# Patient Record
Sex: Male | Born: 1985 | Race: White | Hispanic: No | Marital: Single
Health system: Southern US, Community
[De-identification: ages and names within clinical notes are randomized; demographics above are authoritative.]

## PROBLEM LIST (undated history)

## (undated) DIAGNOSIS — F411 Generalized anxiety disorder: Secondary | ICD-10-CM

## (undated) DIAGNOSIS — F40298 Other specified phobia: Secondary | ICD-10-CM

## (undated) DIAGNOSIS — F339 Major depressive disorder, recurrent, unspecified: Secondary | ICD-10-CM

## (undated) DIAGNOSIS — M549 Dorsalgia, unspecified: Secondary | ICD-10-CM

## (undated) DIAGNOSIS — E669 Obesity, unspecified: Secondary | ICD-10-CM

## (undated) DIAGNOSIS — I10 Essential (primary) hypertension: Secondary | ICD-10-CM

## (undated) DIAGNOSIS — Z915 Personal history of self-harm: Secondary | ICD-10-CM

## (undated) HISTORY — DX: Generalized anxiety disorder: F41.1

## (undated) HISTORY — DX: Essential (primary) hypertension: I10

## (undated) HISTORY — DX: Dorsalgia, unspecified: M54.9

## (undated) HISTORY — DX: Major depressive disorder, recurrent, unspecified: F33.9

## (undated) HISTORY — DX: Obesity, unspecified: E66.9

## (undated) HISTORY — DX: Other specified phobia: F40.298

## (undated) HISTORY — DX: Personal history of self-harm: Z91.5

---
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
## 2006-05-31 DIAGNOSIS — E669 Obesity, unspecified: Secondary | ICD-10-CM | POA: Insufficient documentation

## 2006-05-31 DIAGNOSIS — F339 Major depressive disorder, recurrent, unspecified: Secondary | ICD-10-CM

## 2006-10-03 ENCOUNTER — Emergency Department (HOSPITAL_COMMUNITY): Admission: EM | Admit: 2006-10-03 | Discharge: 2006-10-03 | Payer: Self-pay | Admitting: Emergency Medicine

## 2006-10-04 ENCOUNTER — Telehealth: Payer: Self-pay | Admitting: *Deleted

## 2006-10-08 ENCOUNTER — Telehealth: Payer: Self-pay | Admitting: *Deleted

## 2006-10-09 ENCOUNTER — Ambulatory Visit: Payer: Self-pay | Admitting: Family Medicine

## 2006-10-09 DIAGNOSIS — F411 Generalized anxiety disorder: Secondary | ICD-10-CM | POA: Insufficient documentation

## 2006-10-24 ENCOUNTER — Ambulatory Visit: Payer: Self-pay | Admitting: Family Medicine

## 2006-10-24 DIAGNOSIS — I10 Essential (primary) hypertension: Secondary | ICD-10-CM | POA: Insufficient documentation

## 2007-07-14 ENCOUNTER — Emergency Department (HOSPITAL_COMMUNITY): Admission: EM | Admit: 2007-07-14 | Discharge: 2007-07-14 | Payer: Self-pay | Admitting: Emergency Medicine

## 2007-07-18 ENCOUNTER — Telehealth: Payer: Self-pay | Admitting: *Deleted

## 2007-07-18 ENCOUNTER — Ambulatory Visit: Payer: Self-pay | Admitting: Family Medicine

## 2007-07-18 DIAGNOSIS — R079 Chest pain, unspecified: Secondary | ICD-10-CM

## 2007-07-18 DIAGNOSIS — M545 Low back pain: Secondary | ICD-10-CM

## 2009-11-29 ENCOUNTER — Encounter: Payer: Self-pay | Admitting: Family Medicine

## 2009-11-29 ENCOUNTER — Ambulatory Visit: Payer: Self-pay | Admitting: Family Medicine

## 2009-11-29 DIAGNOSIS — F40298 Other specified phobia: Secondary | ICD-10-CM

## 2009-11-29 LAB — CONVERTED CEMR LAB
ALT: 25 units/L (ref 0–53)
AST: 18 units/L (ref 0–37)
Albumin/Creatinine Ratio, Urine, POC: <30
Albumin: 4.5 g/dL (ref 3.5–5.2)
Alkaline Phosphatase: 71 units/L (ref 39–117)
Calcium: 9.4 mg/dL (ref 8.4–10.5)
Chloride: 101 meq/L (ref 96–112)
Creatinine, Ser: 0.85 mg/dL (ref 0.40–1.50)
Creatinine,U: 200 mg/dL
Platelets: 274 10*3/uL (ref 150–400)
Potassium: 3.8 meq/L (ref 3.5–5.3)
RDW: 12.7 % (ref 11.5–15.5)

## 2010-02-11 ENCOUNTER — Encounter: Payer: Self-pay | Admitting: Family Medicine

## 2010-03-29 ENCOUNTER — Ambulatory Visit: Payer: Self-pay

## 2010-04-28 ENCOUNTER — Ambulatory Visit: Admission: RE | Admit: 2010-04-28 | Discharge: 2010-04-28 | Payer: Self-pay | Source: Home / Self Care

## 2010-05-05 NOTE — Letter (Signed)
Summary: PHQ9  PHQ9   Imported By: De Nurse 12/03/2009 12:03:36  _____________________________________________________________________  External Attachment:    Type:   Image     Comment:   External Document

## 2010-05-05 NOTE — Miscellaneous (Signed)
Summary: refill request   Clinical Lists Changes received refill request for Prozac. This is not on current med list. will send message to MD to advise about this medication and need for refill. Theresia Lo RN  February 11, 2010 2:40 PM  Patient rx'ed celexa at last appointment.  Has canceled or not kept multiple appointments with me.  I need to see the patient again before refilling to know if we need to adjust dosage.  Thanks,  Selena Batten    patient notified of above message and  he has appointment 02/28/2010 and will discuss at that time. Theresia Lo RN  February 14, 2010 1:52 PM
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
## 2010-05-12 ENCOUNTER — Encounter: Payer: Self-pay | Admitting: Family Medicine

## 2010-05-12 ENCOUNTER — Ambulatory Visit (INDEPENDENT_AMBULATORY_CARE_PROVIDER_SITE_OTHER): Payer: Self-pay | Admitting: Family Medicine

## 2010-05-12 VITALS — BP 148/79 | HR 100 | Ht 67.0 in | Wt 260.0 lb

## 2010-05-12 DIAGNOSIS — M545 Low back pain: Secondary | ICD-10-CM

## 2010-05-12 DIAGNOSIS — E669 Obesity, unspecified: Secondary | ICD-10-CM

## 2010-05-12 DIAGNOSIS — F339 Major depressive disorder, recurrent, unspecified: Secondary | ICD-10-CM

## 2010-05-12 MED ORDER — HYDROCODONE-ACETAMINOPHEN 5-500 MG PO TABS: 1.0000 | ORAL_TABLET | Freq: Four times a day (QID) | ORAL | Status: AC | PRN

## 2010-05-12 NOTE — Assessment & Plan Note (Signed)
Assessment/Plan Still believe he has some underlying depression but currently refuses and medication or counseling help.  Denies any thoughts of suicide or homicide.
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
## 2010-05-12 NOTE — Patient Instructions (Addendum)
I want you to try and continue the exercises you are doing and try the stretches I have given you.  I want you to only take the vicodin if you really feel you need it.  Be careful if you drive while taking this.  Remember as we discussed this is to get you past the point so that you are able to exercise more, and is not a long term medication.  I will let you know about your x-rays when the results return.  I would like to see you back in 2-4 weeks to see how you are doing.  If you are not improving we may need to think about formal physical therapy.  Hope you feel better.

## 2010-05-12 NOTE — Assessment & Plan Note (Signed)
Assessment and Plan Reviewed current diet and given suggestions on how to make changes.  For now told him most important thing is to try and get three meals per day.  Also told him that he should try to limit processed foods and try to incorporate more green vegetables into his diet.   Encouraged to continue with exercise and outdoor activity as much as possible as back pain improves

## 2010-08-19 NOTE — Discharge Summary (Signed)
[facility]  Patient:    Eric Dalton, Eric Dalton                     MRN: [id number] Adm. Date:  [id number] Disc. Date: 02/27/00 Attending:  Veneta Penton                           Discharge Summary  REASON FOR ADMISSION:  This 25 year old white male was admitted status post suicide attempt via drug overdose with Effexor.  For further history of present illness, please see the patients psychiatry admission assessment.  PHYSICAL EXAMINATION AT THE TIME OF ADMISSION:  Morbid obesity.  LABORATORY EXAMINATION:  The patient underwent a laboratory workup to rule out any medical problems contributing to his symptomatology.  Metabolic panel was unremarkable.  CBC showed white count 13.8, MCV 74.3, MCHC 34.6, eosinophil count 11%; otherwise unremarkable.  Thyroid function tests were within normal limits.  GGT was within normal limits.  Hepatic panel was remarkable only for SGPT 41.  Urine drug screen was negative.  The patient received no x-rays, no special procedures, no additional consultations.  He sustained no complications during his hospital course.  HOSPITAL COURSE:  On admission, the patient was superficial, guarded, oppositional and defiant.  He participated only superficially in group therapy.  He has refused to take any responsibility for his actions.  He, however, continues to deny any homicidal or suicidal ideation and although he remains depressed and anxious, he is participating in all aspects of the therapeutic treatment program, has plans for the future, and denies wanting to harm himself or others and consequently is felt to have reached his maximum benefits of hospitalization and is ready for discharge to a less restrictive alternative setting.  CONDITION ON DISCHARGE:  Improved.  DIAGNOSES: Axis I:    1. Major depression, single episode, severe without psychosis.            2. 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            3. Rule out  oppositional defiant disorder.            4. Generalized anxiety disorder. Axis II:   Rule out learning disorder, not otherwise specified. Axis III:  Obesity. Axis IV:   Severe. Axis V:    20 on admission, 30 on discharge.  FURTHER EVALUATION AND TREATMENT RECOMMENDATIONS: 1. The patient is discharged to home. 2. The patient is discharged on a unrestricted level of activity and a regular    diet. 3. The patient is discharged on Effexor XR 150 mg p.o. q.a.m. 4. The patient will follow up with Dr. Kirtland Bouchard, his outpatient psychiatrist at [facility] as well as Edwina Barth, his outpatient therapist, for individual and family therapy and he will continue to be followed at [facility] for all further aspects of his mental health care and consequently I will sign off on the case at this time. DD:  02/27/00 TD:  02/27/00 Job: [id number]

## 2015-05-18 ENCOUNTER — Encounter (HOSPITAL_COMMUNITY): Payer: Self-pay | Admitting: Emergency Medicine

## 2015-05-18 DIAGNOSIS — F411 Generalized anxiety disorder: Secondary | ICD-10-CM | POA: Insufficient documentation

## 2015-05-18 DIAGNOSIS — F1310 Sedative, hypnotic or anxiolytic abuse, uncomplicated: Secondary | ICD-10-CM | POA: Insufficient documentation

## 2015-05-18 DIAGNOSIS — F1210 Cannabis abuse, uncomplicated: Secondary | ICD-10-CM | POA: Insufficient documentation

## 2015-05-18 DIAGNOSIS — F332 Major depressive disorder, recurrent severe without psychotic features: Secondary | ICD-10-CM | POA: Insufficient documentation

## 2015-05-18 DIAGNOSIS — E669 Obesity, unspecified: Secondary | ICD-10-CM | POA: Insufficient documentation

## 2015-05-18 DIAGNOSIS — Z915 Personal history of self-harm: Secondary | ICD-10-CM | POA: Insufficient documentation

## 2015-05-18 DIAGNOSIS — I10 Essential (primary) hypertension: Secondary | ICD-10-CM | POA: Insufficient documentation

## 2015-05-18 DIAGNOSIS — Z87828 Personal history of other (healed) physical injury and trauma: Secondary | ICD-10-CM | POA: Insufficient documentation

## 2015-05-18 LAB — RAPID URINE DRUG SCREEN, HOSP PERFORMED
AMPHETAMINES: NOT DETECTED
BENZODIAZEPINES: POSITIVE — AB
Barbiturates: NOT DETECTED
Cocaine: NOT DETECTED
OPIATES: NOT DETECTED
TETRAHYDROCANNABINOL: POSITIVE — AB

## 2015-05-18 LAB — CBC WITH DIFFERENTIAL/PLATELET
BASOS ABS: 0 10*3/uL (ref 0.0–0.1)
Basophils Relative: 0 %
Eosinophils Absolute: 0.1 10*3/uL (ref 0.0–0.7)
Eosinophils Relative: 2 %
HCT: 43.5 % (ref 39.0–52.0)
HEMOGLOBIN: 14.9 g/dL (ref 13.0–17.0)
LYMPHS ABS: 1.9 10*3/uL (ref 0.7–4.0)
LYMPHS PCT: 25 %
MCH: 28.5 pg (ref 26.0–34.0)
MCHC: 34.3 g/dL (ref 30.0–36.0)
MCV: 83.3 fL (ref 78.0–100.0)
Monocytes Absolute: 0.8 10*3/uL (ref 0.1–1.0)
Monocytes Relative: 10 %
NEUTROS PCT: 63 %
Neutro Abs: 4.8 10*3/uL (ref 1.7–7.7)
Platelets: 255 10*3/uL (ref 150–400)
RBC: 5.22 MIL/uL (ref 4.22–5.81)
RDW: 12.8 % (ref 11.5–15.5)
WBC: 7.7 10*3/uL (ref 4.0–10.5)

## 2015-05-18 LAB — COMPREHENSIVE METABOLIC PANEL
ALT: 15 U/L — AB (ref 17–63)
ANION GAP: 10 (ref 5–15)
AST: 17 U/L (ref 15–41)
Albumin: 4.3 g/dL (ref 3.5–5.0)
Alkaline Phosphatase: 73 U/L (ref 38–126)
BUN: 6 mg/dL (ref 6–20)
CHLORIDE: 101 mmol/L (ref 101–111)
CO2: 28 mmol/L (ref 22–32)
Calcium: 9.6 mg/dL (ref 8.9–10.3)
Creatinine, Ser: 1.05 mg/dL (ref 0.61–1.24)
GFR calc non Af Amer: >60 mL/min (ref >60)
Glucose, Bld: 89 mg/dL (ref 65–99)
POTASSIUM: 3.5 mmol/L (ref 3.5–5.1)
SODIUM: 139 mmol/L (ref 135–145)
Total Bilirubin: 0.4 mg/dL (ref 0.3–1.2)
Total Protein: 7.8 g/dL (ref 6.5–8.1)

## 2015-05-18 LAB — ETHANOL: Alcohol, Ethyl (B): <5 mg/dL (ref <5)

## 2015-05-18 NOTE — ED Notes (Signed)
Pt. requesting psychiatric treatment and medications for his anxiety and depression , denies suicidal intention / no hallucinations .

## 2015-05-19 ENCOUNTER — Emergency Department (HOSPITAL_COMMUNITY)
Admission: EM | Admit: 2015-05-19 | Discharge: 2015-05-19 | Disposition: A | Discharge status: Home / Self Care | Payer: Self-pay | Attending: Emergency Medicine | Admitting: Emergency Medicine

## 2015-05-19 DIAGNOSIS — R4681 Obsessive-compulsive behavior: Secondary | ICD-10-CM

## 2015-05-19 DIAGNOSIS — F332 Major depressive disorder, recurrent severe without psychotic features: Secondary | ICD-10-CM

## 2015-05-19 DIAGNOSIS — F411 Generalized anxiety disorder: Secondary | ICD-10-CM

## 2015-05-19 MED ORDER — ZOLPIDEM TARTRATE 5 MG PO TABS: 5.0000 mg | ORAL_TABLET | Freq: Every evening | ORAL | Status: DC | PRN

## 2015-05-19 MED ORDER — ONDANSETRON HCL 4 MG PO TABS: 4.0000 mg | ORAL_TABLET | Freq: Three times a day (TID) | ORAL | Status: DC | PRN

## 2015-05-19 MED ORDER — IBUPROFEN 200 MG PO TABS: 600.0000 mg | ORAL_TABLET | Freq: Three times a day (TID) | ORAL | Status: DC | PRN

## 2015-05-19 MED ORDER — ALUM & MAG HYDROXIDE-SIMETH 200-200-20 MG/5ML PO SUSP: 30.0000 mL | ORAL | Status: DC | PRN

## 2015-05-19 MED ORDER — NICOTINE 21 MG/24HR TD PT24: 21.0000 mg | MEDICATED_PATCH | Freq: Every day | TRANSDERMAL | Status: DC

## 2015-05-19 MED ORDER — ACETAMINOPHEN 325 MG PO TABS: 650.0000 mg | ORAL_TABLET | ORAL | Status: DC | PRN

## 2015-05-19 MED ORDER — HYDROXYZINE HCL 25 MG PO TABS
25.0000 mg | ORAL_TABLET | Freq: Once | ORAL | Status: DC
  Filled 2015-05-19: qty 1

## 2015-05-19 NOTE — ED Provider Notes (Signed)
Received phone call from behavioral health after evaluation. Patient is not felt to be a threat to himself or others. He is not homicidal or suicidal. They recommended discharge with resources. I did discuss this with the patient and his family. He confirms that he is not homicidal or suicidal. Family is comfortable with him going home. He was provided with resources and will follow-up. Patient was counseled return to the ER if he has any thoughts of harming himself or others.  Gilda Crease, MD 05/19/15 [phone]
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
## 2015-05-19 NOTE — Discharge Instructions (Signed)
Substance Abuse Treatment Programs ° °Intensive Outpatient Programs °[facility]     °[address]      °[place], [place]                   °[phone]      ° °[facility] °[address] °[place], [place] °[phone] ° °[facility]     °(Inpatient and outpatient)     °[address]           °[phone]   ° °[facility] °336-288-1484 (Suboxone and Methadone) ° °[address]      °[place], [place] [address]      °336-882-2125      ° °[address] °[place], [place] °852-3033 ° °[facility] (Outpatient/Inpatient, Chemical)    °(insurance only) [phone]      °       °[facility] (Groups & Residential) °[place], [place] °336-389-1413 ° °   °[facility]     °[address]     °[place], [place]      °[phone]      ° °[facility] (for caregivers and family) °[address] °[place], [place] °336-299-4655 ° ° ° ° ° °Residential Treatment Programs °[facility]      °[address], [place], [place] [address]  °([phone]      ° °[facility] °[address] [place], [place] [address] °[phone] ° °[facility]        °336-248-8914      ° °[facility] °1-800-659-3381 ° °[facility]             °[address]                                         °[place], [place]                                                °877-615-2722 or 336-784-9470                              ° °[facility] [place] °[address] °[place], [address] °1.877.941.8954 ° °[facility]    °[address]      °[place], [place]     °[phone]      ° °[facility] °[address] °[place], [place] °336-285-9073 ° °[facility]   °[address]     °[place], [place] [address]     °[phone]      °Admissions: 8am-3pm M-F ° °[facility] °[address] °[place],  [place] °336-227-7417 ° °[facility]: Residential Program (90 Days)   °[place], [place]      °336-725-8389 or 800-758-6077    ° °[facility] °[place], [place] °(Walk in Hours over the weekend or by referral) ° °[facility] °[address], [place],  [address] °(336) [phone] ° °Crisis Mobile: [facility]:  1-877-626-1772 (for crisis response 24 hours a day) °[facility] Hotline:      1-800-256-2452 °Outpatient Psychiatry and Counseling ° °[facility]: Mobile Crisis   Management 24 hours:  1-877-626-1772 ° °[facility] sliding scale fee and walk in schedule: M-F 8am-12pm/1pm-3pm °[address]  °[place], [place] [address] °336-387-6161 ° °[facility] °[address] °[place], [place] [address] °336-703-9650 ° °[facility] (Formerly known [facility])- new patient walk-in appointments available Monday - Friday 8am -3pm.          °[address] °[place], [place] [address] °336-676-6840 or crisis line- 336-676-6905 ° °[facility] Intensive Outpatient Therapy Program °[address] °[place], [place] [address] °336-832-9804 ° °[facility]                  °Crisis Services      °[phone]      °[address]     °[place], [place] [address]                ° °[facility]   °[facility] °[phone] °[address] °[place], [place] [address] ° ° °[facility]          °[address],  °[place], [place] [address]       °(336) [phone] ° °[facility] °[address] °[place], [place] [address] °336-292-1510 ° °[facility]    °[address]    °[place], [place] [address]     °336-632-3505      ° °Parish McKinney, MD     °[address]     °[place] [address]     °336-282-1251     °  °[facility] °[address] °[place] [address] ° °[facility]     °[address]     °[place], [place]      °336-542-2076      ° °[facility] °Shamsher Ahluwalia, MD °[address] °[place], [place] [address] °336-420-9558 ° °[facility]     °[address]     °[place], [place] [address]     °336-274-1237      ° °[facility] °[address] °[place], [place] [address] °336-854-4450 °Resources for Temporary Residential Assistance/Crisis Centers ° °DAY CENTERS °[facility] °M-F 8am-3pm   °[address] [place], [place] [address]   336-332-0824 °Services include: laundry, barbering, support groups, case management, phone  & computer access, showers, AA/NA mtgs, mental health/substance abuse nurse, job skills class, disability information, VA assistance, spiritual classes, etc.  ° °HOMELESS SHELTERS ° °[facility]     °[facility]   °[address], [place]     °336.271.5959       °       °[facility] (women and children)       °[address] °[place], [place] [address] °336-275-0820 °[email or web address] for application and process °Application Required ° °[facility]   °[address]    °[place] [address]     °336.886.4922       °             °[facility] °[address] °,  [address] °336.273.5572 °336-235-0363(schedule application appt.) °Application Required ° °[facility] (women only)    °[address]     °[place],  [address]     °336-884-1039      °  Intake starts 6pm daily °Need valid ID, SSC, & Police report °[facility] [place] °[address] °[place], [place] °336-881-5420 °Application Required ° °[facility] (men only)     °[address]      °[place], [place]     °[phone]      ° °[facility] °(Pregnant women only) °[address] °[place], [place] °336-275-0206 ° °[facility]      °[address]      °[place], [place] [address]     °336-722-9951      °       °[facility] °[address] °[place], [place] °336-723-1848 °90 day commitment/SA/Application process ° °[facility](men only)     °[address]     °[place], [place]     °[phone]       °Check-in at 7pm     °       °[facility] °[address] °[place], [place] [address] °336-248-6684 °Men/Women/Women and Children must be there by 7 pm ° °[facility] °[place], [place] °336-722-8721                ° °

## 2015-06-08 ENCOUNTER — Emergency Department: Payer: Self-pay

## 2015-06-08 ENCOUNTER — Encounter: Payer: Self-pay | Admitting: Intensive Care

## 2015-06-08 ENCOUNTER — Observation Stay
Admission: EM | Admit: 2015-06-08 | Discharge: 2015-06-11 | Disposition: A | Discharge status: Home / Self Care | Payer: Self-pay | Attending: Surgery | Admitting: Surgery

## 2015-06-08 DIAGNOSIS — R079 Chest pain, unspecified: Secondary | ICD-10-CM | POA: Insufficient documentation

## 2015-06-08 DIAGNOSIS — Z6829 Body mass index (BMI) 29.0-29.9, adult: Secondary | ICD-10-CM | POA: Insufficient documentation

## 2015-06-08 DIAGNOSIS — F411 Generalized anxiety disorder: Secondary | ICD-10-CM | POA: Insufficient documentation

## 2015-06-08 DIAGNOSIS — E669 Obesity, unspecified: Secondary | ICD-10-CM | POA: Insufficient documentation

## 2015-06-08 DIAGNOSIS — R1031 Right lower quadrant pain: Secondary | ICD-10-CM | POA: Insufficient documentation

## 2015-06-08 DIAGNOSIS — K810 Acute cholecystitis: Secondary | ICD-10-CM

## 2015-06-08 DIAGNOSIS — F408 Other phobic anxiety disorders: Secondary | ICD-10-CM | POA: Insufficient documentation

## 2015-06-08 DIAGNOSIS — R112 Nausea with vomiting, unspecified: Secondary | ICD-10-CM | POA: Insufficient documentation

## 2015-06-08 DIAGNOSIS — F339 Major depressive disorder, recurrent, unspecified: Secondary | ICD-10-CM | POA: Insufficient documentation

## 2015-06-08 DIAGNOSIS — I10 Essential (primary) hypertension: Secondary | ICD-10-CM | POA: Insufficient documentation

## 2015-06-08 DIAGNOSIS — K8010 Calculus of gallbladder with chronic cholecystitis without obstruction: Secondary | ICD-10-CM | POA: Diagnosis present

## 2015-06-08 DIAGNOSIS — K8012 Calculus of gallbladder with acute and chronic cholecystitis without obstruction: Principal | ICD-10-CM | POA: Insufficient documentation

## 2015-06-08 DIAGNOSIS — M549 Dorsalgia, unspecified: Secondary | ICD-10-CM | POA: Insufficient documentation

## 2015-06-08 LAB — COMPREHENSIVE METABOLIC PANEL
ALK PHOS: 76 U/L (ref 38–126)
ALT: 13 U/L — AB (ref 17–63)
AST: 18 U/L (ref 15–41)
Albumin: 4.9 g/dL (ref 3.5–5.0)
Anion gap: 13 (ref 5–15)
BUN: 8 mg/dL (ref 6–20)
CALCIUM: 9.5 mg/dL (ref 8.9–10.3)
CHLORIDE: 100 mmol/L — AB (ref 101–111)
CO2: 25 mmol/L (ref 22–32)
CREATININE: 0.9 mg/dL (ref 0.61–1.24)
Glucose, Bld: 106 mg/dL — ABNORMAL HIGH (ref 65–99)
Potassium: 3.6 mmol/L (ref 3.5–5.1)
Sodium: 138 mmol/L (ref 135–145)
Total Bilirubin: 0.5 mg/dL (ref 0.3–1.2)
Total Protein: 8.6 g/dL — ABNORMAL HIGH (ref 6.5–8.1)

## 2015-06-08 LAB — URINE DRUG SCREEN, QUALITATIVE (ARMC ONLY)
AMPHETAMINES, UR SCREEN: NOT DETECTED
Barbiturates, Ur Screen: NOT DETECTED
Benzodiazepine, Ur Scrn: POSITIVE — AB
Cannabinoid 50 Ng, Ur ~~LOC~~: POSITIVE — AB
Cocaine Metabolite,Ur ~~LOC~~: NOT DETECTED
MDMA (ECSTASY) UR SCREEN: NOT DETECTED
Methadone Scn, Ur: NOT DETECTED
OPIATE, UR SCREEN: NOT DETECTED
PHENCYCLIDINE (PCP) UR S: NOT DETECTED
Tricyclic, Ur Screen: NOT DETECTED

## 2015-06-08 LAB — URINALYSIS COMPLETE WITH MICROSCOPIC (ARMC ONLY)
BILIRUBIN URINE: NEGATIVE
Bacteria, UA: NONE SEEN
Glucose, UA: NEGATIVE mg/dL
Hgb urine dipstick: NEGATIVE
Leukocytes, UA: NEGATIVE
Nitrite: NEGATIVE
Protein, ur: NEGATIVE mg/dL
Specific Gravity, Urine: 1.017 (ref 1.005–1.030)
Squamous Epithelial / LPF: NONE SEEN
pH: 8 (ref 5.0–8.0)

## 2015-06-08 LAB — CBC WITH DIFFERENTIAL/PLATELET
Basophils Absolute: 0 10*3/uL (ref 0–0.1)
Basophils Relative: 0 %
EOS ABS: 0 10*3/uL (ref 0–0.7)
EOS PCT: 0 %
HCT: 43 % (ref 40.0–52.0)
Hemoglobin: 14.7 g/dL (ref 13.0–18.0)
LYMPHS ABS: 0.7 10*3/uL — AB (ref 1.0–3.6)
LYMPHS PCT: 6 %
MCH: 28.2 pg (ref 26.0–34.0)
MCHC: 34.1 g/dL (ref 32.0–36.0)
MCV: 82.8 fL (ref 80.0–100.0)
MONO ABS: 0.6 10*3/uL (ref 0.2–1.0)
MONOS PCT: 6 %
NEUTROS PCT: 88 %
Neutro Abs: 10.6 10*3/uL — ABNORMAL HIGH (ref 1.4–6.5)
PLATELETS: 249 10*3/uL (ref 150–440)
RBC: 5.19 MIL/uL (ref 4.40–5.90)
RDW: 13.4 % (ref 11.5–14.5)
WBC: 11.9 10*3/uL — ABNORMAL HIGH (ref 3.8–10.6)

## 2015-06-08 LAB — LIPASE, BLOOD: LIPASE: 13 U/L (ref 11–51)

## 2015-06-08 MED ORDER — SODIUM CHLORIDE 0.9 % IV BOLUS (SEPSIS)
1000.0000 mL | Freq: Once | INTRAVENOUS | Status: AC
  Administered 2015-06-08: 1000 mL via INTRAVENOUS

## 2015-06-08 MED ORDER — MORPHINE SULFATE (PF) 4 MG/ML IV SOLN
4.0000 mg | Freq: Once | INTRAVENOUS | Status: AC
  Administered 2015-06-08: 4 mg via INTRAVENOUS
  Filled 2015-06-08: qty 1

## 2015-06-08 MED ORDER — HYDROCODONE-ACETAMINOPHEN 5-325 MG PO TABS
1.0000 | ORAL_TABLET | ORAL | Status: DC | PRN
  Administered 2015-06-09: 1 via ORAL
  Administered 2015-06-11 (×2): 2 via ORAL
  Filled 2015-06-08: qty 1
  Filled 2015-06-08 (×2): qty 2

## 2015-06-08 MED ORDER — ONDANSETRON HCL 4 MG/2ML IJ SOLN
4.0000 mg | Freq: Once | INTRAMUSCULAR | Status: AC
  Administered 2015-06-08: 4 mg via INTRAVENOUS
  Filled 2015-06-08: qty 2

## 2015-06-08 MED ORDER — ONDANSETRON HCL 4 MG/2ML IJ SOLN
4.0000 mg | Freq: Four times a day (QID) | INTRAMUSCULAR | Status: DC | PRN
  Administered 2015-06-08 – 2015-06-09 (×3): 4 mg via INTRAVENOUS
  Filled 2015-06-08 (×3): qty 2

## 2015-06-08 MED ORDER — IOHEXOL 240 MG/ML SOLN
25.0000 mL | Freq: Once | INTRAMUSCULAR | Status: AC | PRN
  Administered 2015-06-08: 25 mL via ORAL

## 2015-06-08 MED ORDER — ENOXAPARIN SODIUM 40 MG/0.4ML ~~LOC~~ SOLN
40.0000 mg | SUBCUTANEOUS | Status: DC
  Administered 2015-06-08 – 2015-06-10 (×3): 40 mg via SUBCUTANEOUS
  Filled 2015-06-08 (×3): qty 0.4

## 2015-06-08 MED ORDER — KCL IN DEXTROSE-NACL 20-5-0.45 MEQ/L-%-% IV SOLN
INTRAVENOUS | Status: DC
  Administered 2015-06-08 – 2015-06-10 (×4): via INTRAVENOUS
  Filled 2015-06-08 (×6): qty 1000

## 2015-06-08 MED ORDER — ONDANSETRON 4 MG PO TBDP
4.0000 mg | ORAL_TABLET | Freq: Four times a day (QID) | ORAL | Status: DC | PRN
  Filled 2015-06-08: qty 1

## 2015-06-08 MED ORDER — MORPHINE SULFATE (PF) 4 MG/ML IV SOLN
INTRAVENOUS | Status: AC
  Filled 2015-06-08: qty 1

## 2015-06-08 MED ORDER — PANTOPRAZOLE SODIUM 40 MG PO TBEC
40.0000 mg | DELAYED_RELEASE_TABLET | Freq: Two times a day (BID) | ORAL | Status: DC
  Administered 2015-06-08 – 2015-06-11 (×5): 40 mg via ORAL
  Filled 2015-06-08 (×5): qty 1

## 2015-06-08 MED ORDER — PIPERACILLIN-TAZOBACTAM 3.375 G IVPB 30 MIN
3.3750 g | Freq: Once | INTRAVENOUS | Status: AC
  Administered 2015-06-08: 3.375 g via INTRAVENOUS
  Filled 2015-06-08: qty 50

## 2015-06-08 MED ORDER — KETOROLAC TROMETHAMINE 30 MG/ML IJ SOLN
30.0000 mg | Freq: Once | INTRAMUSCULAR | Status: AC
  Administered 2015-06-08: 30 mg via INTRAVENOUS
  Filled 2015-06-08: qty 1

## 2015-06-08 MED ORDER — ALPRAZOLAM 1 MG PO TABS: 1.0000 mg | ORAL_TABLET | Freq: Two times a day (BID) | ORAL | Status: DC | PRN

## 2015-06-08 MED ORDER — HYDROMORPHONE HCL 1 MG/ML IJ SOLN
1.0000 mg | INTRAMUSCULAR | Status: DC | PRN
  Administered 2015-06-08 – 2015-06-11 (×14): 1 mg via INTRAVENOUS
  Filled 2015-06-08 (×14): qty 1

## 2015-06-08 MED ORDER — PIPERACILLIN-TAZOBACTAM 3.375 G IVPB
3.3750 g | Freq: Three times a day (TID) | INTRAVENOUS | Status: DC
  Administered 2015-06-08 – 2015-06-11 (×7): 3.375 g via INTRAVENOUS
  Filled 2015-06-08 (×9): qty 50

## 2015-06-08 MED ORDER — ACETAMINOPHEN 650 MG RE SUPP: 650.0000 mg | Freq: Four times a day (QID) | RECTAL | Status: DC | PRN

## 2015-06-08 MED ORDER — ACETAMINOPHEN 325 MG PO TABS: 650.0000 mg | ORAL_TABLET | Freq: Four times a day (QID) | ORAL | Status: DC | PRN

## 2015-06-08 MED ORDER — SERTRALINE HCL 50 MG PO TABS
50.0000 mg | ORAL_TABLET | Freq: Every day | ORAL | Status: DC
  Administered 2015-06-08 – 2015-06-10 (×3): 50 mg via ORAL
  Filled 2015-06-08 (×3): qty 1

## 2015-06-08 MED ORDER — PIPERACILLIN-TAZOBACTAM 3.375 G IVPB
INTRAVENOUS | Status: AC
  Filled 2015-06-08: qty 50

## 2015-06-08 MED ORDER — IOHEXOL 350 MG/ML SOLN
100.0000 mL | Freq: Once | INTRAVENOUS | Status: AC | PRN
  Administered 2015-06-08: 100 mL via INTRAVENOUS

## 2015-06-08 NOTE — ED Notes (Signed)
Patient ambulatory to restroom with NAD noted
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
## 2015-06-08 NOTE — Progress Notes (Signed)
LCSW met again with family and provided handouts for resources in [place]. Patient s family was very kind but disclosed aunt diabetic- she was encouraged to ask nurse for apple or fluids if she felt her sugar drop. They agreed. LCSW did collect information from patient, he is supported by [facility] and sees a psychiatrist regular and has medication from a med clinic ( free).  Reviewed other places patient can access for more information such as the [facility], patient stated he has already been there and sees a SW there. No further needs required from [place]

## 2015-06-08 NOTE — ED Notes (Signed)
Admitting MD at bedside.
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
## 2015-06-08 NOTE — Progress Notes (Signed)
LCSW met with both patient and family. Patient was in a lot of pain during our brief interview and not really communicated. He reported he has had some extreme pain, he is concerned at this point because of no insurance. Mother reports patient is from [facility]. LCSW agreed to provided Dss office information in [place]. [employer] LCSW [phone]

## 2015-06-08 NOTE — ED Notes (Signed)
Patient c/o mid lower back pain since 0200am. Denies any trauma to back. Denies SOB or chest pain. Patient ambulatory to bed and restroom with NAD noted

## 2015-06-09 MED ORDER — ONDANSETRON 8 MG PO TBDP: 4.0000 mg | ORAL_TABLET | ORAL | Status: DC | PRN

## 2015-06-09 MED ORDER — ONDANSETRON HCL 4 MG/2ML IJ SOLN: 4.0000 mg | INTRAMUSCULAR | Status: DC | PRN

## 2015-06-09 NOTE — Clinical Social Work Note (Signed)
CSW consulted by nursing due to needing assistance with medications. RN CM will see patient for this concern. Please reconsult CSW if needed. [place] SpanielMonica Nani Ingram MSW,LCSW [phone]
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
## 2015-06-09 NOTE — Progress Notes (Signed)
30 year old with acute cholecystitis. Patient states that he still having some nausea however he hasn't had anymore vomiting. Patient states he still having some epigastric pain that moves to the right side.  Filed Vitals:   06/09/15 1130 06/09/15 2018  BP: 125/69 140/86  Pulse: 80 88  Temp: 98.3 F (36.8 C) 99.5 F (37.5 C)  Resp: 16 20   PE:  Gen: NAD Abd: soft, epigastric tenderness  Ext: 2 + pulses, no edema  A/P:  30 yr old male with acute choelcystitis, continue IV antibiotic and NPO.  The risks, benefits, complications, treatment options, and expected outcomes were discussed with the patient. The possibilities of bleeding, recurrent infection, finding a normal gallbladder, perforation of viscus organs, damage to surrounding structures, bile leak, abscess formation, needing a drain placed, the need for additional procedures, reaction to medication, pulmonary aspiration,  failure to diagnose a condition, the possible need to convert to an open procedure, and creating a complication requiring transfusion or operation were discussed with the patient. The patient and/or family concurred with the proposed plan, giving informed consent. We will put him on the schedule for tomorrow afternoon.

## 2015-06-10 ENCOUNTER — Observation Stay: Payer: Self-pay | Admitting: Certified Registered Nurse Anesthetist

## 2015-06-10 ENCOUNTER — Encounter: Payer: Self-pay | Admitting: *Deleted

## 2015-06-10 ENCOUNTER — Encounter
Admission: EM | Disposition: A | Discharge status: Home / Self Care | Payer: Self-pay | Source: Home / Self Care | Attending: Emergency Medicine

## 2015-06-10 DIAGNOSIS — K810 Acute cholecystitis: Secondary | ICD-10-CM

## 2015-06-10 HISTORY — PX: CHOLECYSTECTOMY: SHX55

## 2015-06-10 LAB — MRSA PCR SCREENING: MRSA by PCR: NEGATIVE

## 2015-06-10 SURGERY — LAPAROSCOPIC CHOLECYSTECTOMY WITH INTRAOPERATIVE CHOLANGIOGRAM
Anesthesia: General | Wound class: Clean Contaminated

## 2015-06-10 MED ORDER — ROCURONIUM BROMIDE 100 MG/10ML IV SOLN
INTRAVENOUS | Status: DC | PRN
  Administered 2015-06-10: 40 mg via INTRAVENOUS

## 2015-06-10 MED ORDER — SUGAMMADEX SODIUM 200 MG/2ML IV SOLN
INTRAVENOUS | Status: DC | PRN
  Administered 2015-06-10: 177 mg via INTRAVENOUS

## 2015-06-10 MED ORDER — ONDANSETRON HCL 4 MG/2ML IJ SOLN
INTRAMUSCULAR | Status: DC | PRN
  Administered 2015-06-10: 4 mg via INTRAVENOUS

## 2015-06-10 MED ORDER — BUPIVACAINE HCL (PF) 0.5 % IJ SOLN
INTRAMUSCULAR | Status: DC | PRN
  Administered 2015-06-10: 28 mL

## 2015-06-10 MED ORDER — KETOROLAC TROMETHAMINE 30 MG/ML IJ SOLN
INTRAMUSCULAR | Status: DC | PRN
  Administered 2015-06-10: 30 mg via INTRAVENOUS

## 2015-06-10 MED ORDER — FENTANYL CITRATE (PF) 100 MCG/2ML IJ SOLN
INTRAMUSCULAR | Status: DC | PRN
  Administered 2015-06-10: 100 ug via INTRAVENOUS

## 2015-06-10 MED ORDER — LACTATED RINGERS IV SOLN
INTRAVENOUS | Status: DC
  Administered 2015-06-10: 125 mL/h via INTRAVENOUS
  Administered 2015-06-10: 23:00:00 via INTRAVENOUS

## 2015-06-10 MED ORDER — FENTANYL CITRATE (PF) 100 MCG/2ML IJ SOLN: 25.0000 ug | INTRAMUSCULAR | Status: DC | PRN

## 2015-06-10 MED ORDER — DEXAMETHASONE SODIUM PHOSPHATE 10 MG/ML IJ SOLN
INTRAMUSCULAR | Status: DC | PRN
  Administered 2015-06-10: 8 mg via INTRAVENOUS

## 2015-06-10 MED ORDER — LIDOCAINE HCL (CARDIAC) 20 MG/ML IV SOLN
INTRAVENOUS | Status: DC | PRN
  Administered 2015-06-10: 80 mg via INTRAVENOUS

## 2015-06-10 MED ORDER — ACETAMINOPHEN 10 MG/ML IV SOLN
INTRAVENOUS | Status: AC
  Filled 2015-06-10: qty 100

## 2015-06-10 MED ORDER — CYCLOBENZAPRINE HCL 10 MG PO TABS
10.0000 mg | ORAL_TABLET | Freq: Three times a day (TID) | ORAL | Status: DC
  Administered 2015-06-10 – 2015-06-11 (×3): 10 mg via ORAL
  Filled 2015-06-10 (×3): qty 1

## 2015-06-10 MED ORDER — BUPIVACAINE HCL (PF) 0.5 % IJ SOLN
INTRAMUSCULAR | Status: AC
  Filled 2015-06-10: qty 30

## 2015-06-10 MED ORDER — ONDANSETRON HCL 4 MG/2ML IJ SOLN: 4.0000 mg | Freq: Once | INTRAMUSCULAR | Status: DC | PRN

## 2015-06-10 MED ORDER — PROPOFOL 10 MG/ML IV BOLUS
INTRAVENOUS | Status: DC | PRN
  Administered 2015-06-10: 170 mg via INTRAVENOUS

## 2015-06-10 MED ORDER — ACETAMINOPHEN 10 MG/ML IV SOLN
INTRAVENOUS | Status: DC | PRN
  Administered 2015-06-10: 1000 mg via INTRAVENOUS

## 2015-06-10 MED ORDER — MIDAZOLAM HCL 2 MG/2ML IJ SOLN
INTRAMUSCULAR | Status: DC | PRN
  Administered 2015-06-10: 2 mg via INTRAVENOUS

## 2015-06-10 MED ORDER — SUCCINYLCHOLINE CHLORIDE 20 MG/ML IJ SOLN
INTRAMUSCULAR | Status: DC | PRN
  Administered 2015-06-10: 100 mg via INTRAVENOUS

## 2015-06-10 SURGICAL SUPPLY — 43 items
APPLIER CLIP 5 13 M/L LIGAMAX5 (MISCELLANEOUS) ×3
APR CLP MED LRG 5 ANG JAW (MISCELLANEOUS) ×1
BLADE CLIPPER SURG (BLADE) ×2 IMPLANT
BLADE SURG 15 STRL LF DISP TIS (BLADE) ×1 IMPLANT
BLADE SURG 15 STRL SS (BLADE) ×3
CANISTER SUCT 1200ML W/VALVE (MISCELLANEOUS) ×3 IMPLANT
CATH CHOLANGI 4FR 420404F (CATHETERS) ×1 IMPLANT
CHLORAPREP W/TINT 26ML (MISCELLANEOUS) ×3 IMPLANT
CLIP APPLIE 5 13 M/L LIGAMAX5 (MISCELLANEOUS) ×1 IMPLANT
CONRAY 60ML FOR OR (MISCELLANEOUS) ×1 IMPLANT
DEFOGGER SCOPE WARMER CLEARIFY (MISCELLANEOUS) ×3 IMPLANT
ELECT CAUTERY BLADE 6.4 (BLADE) ×2 IMPLANT
ELECT E-Z MONOPOLAR 33 (MISCELLANEOUS) ×3
ELECT REM PT RETURN 9FT ADLT (ELECTROSURGICAL) ×3
ELECTRODE E-Z MONOPOLAR 33 (MISCELLANEOUS) ×1 IMPLANT
ELECTRODE REM PT RTRN 9FT ADLT (ELECTROSURGICAL) ×1 IMPLANT
ENDOPOUCH RETRIEVER 10 (MISCELLANEOUS) ×3 IMPLANT
GLOVE PI ORTHOPRO 6.5 (GLOVE) ×2
GLOVE PI ORTHOPRO STRL 6.5 (GLOVE) ×1 IMPLANT
GOWN STRL REUS W/ TWL LRG LVL3 (GOWN DISPOSABLE) ×3 IMPLANT
GOWN STRL REUS W/TWL LRG LVL3 (GOWN DISPOSABLE) ×9
IRRIGATION STRYKERFLOW (MISCELLANEOUS) ×1 IMPLANT
IRRIGATOR STRYKERFLOW (MISCELLANEOUS) ×3
IV CATH ANGIO 12GX3 LT BLUE (NEEDLE) ×1 IMPLANT
IV NS 1000ML (IV SOLUTION) ×3
IV NS 1000ML BAXH (IV SOLUTION) ×1 IMPLANT
LABEL OR SOLS (LABEL) ×3 IMPLANT
LIQUID BAND (GAUZE/BANDAGES/DRESSINGS) ×3 IMPLANT
NDL HYPO 25X1 1.5 SAFETY (NEEDLE) ×1 IMPLANT
NEEDLE HYPO 25X1 1.5 SAFETY (NEEDLE) ×3 IMPLANT
NS IRRIG 500ML POUR BTL (IV SOLUTION) ×3 IMPLANT
PACK LAP CHOLECYSTECTOMY (MISCELLANEOUS) ×3 IMPLANT
PENCIL ELECTRO HAND CTR (MISCELLANEOUS) ×3 IMPLANT
SCISSORS METZENBAUM CVD 33 (INSTRUMENTS) ×3 IMPLANT
SLEEVE ENDOPATH XCEL 5M (ENDOMECHANICALS) ×6 IMPLANT
SUT MNCRL 4-0 (SUTURE) ×3
SUT MNCRL 4-0 27XMFL (SUTURE) ×1
SUT VIC AB 0 UR5 27 (SUTURE) ×2 IMPLANT
SUT VICRYL 0 AB UR-6 (SUTURE) ×4 IMPLANT
SUTURE MNCRL 4-0 27XMF (SUTURE) ×1 IMPLANT
TROCAR XCEL BLUNT TIP 100MML (ENDOMECHANICALS) ×3 IMPLANT
TROCAR XCEL NON-BLD 5MMX100MML (ENDOMECHANICALS) ×3 IMPLANT
TUBING INSUFFLATOR HI FLOW (MISCELLANEOUS) ×3 IMPLANT

## 2015-06-10 NOTE — OR Nursing (Signed)
Explained to patient the importance of cleaning his mouth prior to surgery.  He was willing to use the cleansing solution to swish and spit and also used the soft foam brush to wipe around his teeth, tongue and cheeks. Anxious but appropriate mood for preop.

## 2015-06-10 NOTE — Anesthesia Postprocedure Evaluation (Signed)
Anesthesia Post Note  Patient: Eric CarpenCasey L Dalton  Procedure(s) Performed: Procedure(s) (LRB): LAPAROSCOPIC CHOLECYSTECTOMY  (N/A)  Patient location during evaluation: PACU Anesthesia Type: General Level of consciousness: awake and alert Pain management: pain level controlled Vital Signs Assessment: post-procedure vital signs reviewed and stable Respiratory status: spontaneous breathing, nonlabored ventilation, respiratory function stable and patient connected to nasal cannula oxygen Cardiovascular status: blood pressure returned to baseline and stable Postop Assessment: no signs of nausea or vomiting Anesthetic complications: no    Last Vitals:  Filed Vitals:   06/10/15 1451 06/10/15 1606  BP: 129/71 126/63  Pulse: 77 73  Temp: 36.9 C 36.9 C  Resp: 20 18    Last Pain:  Filed Vitals:   06/10/15 1606  PainSc: Asleep                 Lotta Frankenfield S

## 2015-06-10 NOTE — Op Note (Signed)
Laparoscopic Cholecystectomy Procedure Note  Indications: This patient presents with symptomatic gallbladder disease and will undergo laparoscopic cholecystectomy.  Pre-operative Diagnosis: Calculus of gallbladder with acute cholecystitis, without mention of obstruction  Post-operative Diagnosis: Same  Surgeon: Gladis Riffleatherine L Safari Cinque   Assistants: none  Anesthesia: General endotracheal anesthesia  ASA Class: 2  Procedure Details  The patient was seen again in the Holding Room. The risks, benefits, complications, treatment options, and expected outcomes were discussed with the patient. The possibilities of reaction to medication, pulmonary aspiration, perforation of viscus, bleeding, recurrent infection, finding a normal gallbladder, the need for additional procedures, failure to diagnose a condition, the possible need to convert to an open procedure, and creating a complication requiring transfusion or operation were discussed with the patient. The patient and/or family concurred with the proposed plan, giving informed consent.  The patient was taken to Operating Room, identified as Eric Dalton and the procedure verified as Laparoscopic Cholecystectomy. A Time Out was held and the above information confirmed.  Prior to the induction of general anesthesia, antibiotic prophylaxis was administered. General endotracheal anesthesia was then administered and tolerated well. After the induction, the abdomen was prepped in the usual sterile fashion. The patient was positioned in the supine position with the left arm comfortably tucked, along with some reverse Trendelenburg.  Local anesthetic agent was injected into the skin near the umbilicus and an incision made. The midline fascia was incised and the Hasson technique was used to introduce a 10 mm port under direct vision. It was secured with a figure of eight Vicryl suture placed in the usual fashion. Pneumoperitoneum was then created with CO2 and  tolerated well without any adverse changes in the patient's vital signs. Additional trocars were introduced under direct vision. All skin incisions were infiltrated with a local anesthetic agent before making the incision and placing the trocars.   The gallbladder was identified, the fundus grasped and retracted cephalad. Adhesions were lysed bluntly and with the electrocautery where indicated, taking care not to injure any adjacent organs or viscus. The infundibulum was grasped and retracted laterally, exposing the peritoneum overlying the triangle of Calot. This was then divided and exposed in a blunt fashion. The cystic duct was clearly identified and bluntly dissected circumferentially. The junctions of the gallbladder, cystic duct and common bile duct were clearly identified prior to the division of any linear structure.  The cystic duct was then doubly ligated with surgical clips on the patient side and singly clipped on the gallbladder side and divided. The cystic artery was identified, dissected free, ligated with clips and divided as well.   The gallbladder was dissected from the liver bed in retrograde fashion with the electrocautery. The gallbladder was removed. The liver bed was irrigated and inspected. Hemostasis was achieved with the electrocautery. Copious irrigation was utilized and was repeatedly aspirated until clear all particulate matter.  Pneumoperitoneum was completely reduced after viewing removal of the trocars under direct vision. The wound was thoroughly irrigated and the fascia was then closed with a figure of eight suture; the skin was then closed with 4-0 Monocryl and a sterile glue was applied.  Instrument, sponge, and needle counts were correct at closure and at the conclusion of the case.   Findings: Cholecystitis with Cholelithiasis  Estimated Blood Loss: Minimal         Drains: none         Total IV Fluids: 1000mL         Specimens: Gallbladder  Complications: None; patient tolerated the procedure well.         Disposition: PACU - hemodynamically stable.         Condition: stable

## 2015-06-10 NOTE — Anesthesia Procedure Notes (Signed)
Procedure Name: Intubation Performed by: Waco Foerster Pre-anesthesia Checklist: Patient identified, Patient being monitored, Timeout performed, Emergency Drugs available and Suction available Patient Re-evaluated:Patient Re-evaluated prior to inductionOxygen Delivery Method: Circle system utilized Preoxygenation: Pre-oxygenation with 100% oxygen Intubation Type: IV induction Ventilation: Mask ventilation without difficulty Laryngoscope Size: Mac and 3 Grade View: Grade I Tube type: Oral Tube size: 7.0 mm Number of attempts: 1 Airway Equipment and Method: Stylet Placement Confirmation: ETT inserted through vocal cords under direct vision,  positive ETCO2 and breath sounds checked- equal and bilateral Secured at: 21 cm Tube secured with: Tape Dental Injury: Teeth and Oropharynx as per pre-operative assessment        

## 2015-06-10 NOTE — Transfer of Care (Signed)
Immediate Anesthesia Transfer of Care Note  Patient: Eric Dalton L Fredman  Procedure(s) Performed: Procedure(s): LAPAROSCOPIC CHOLECYSTECTOMY  (N/A)  Patient Location: PACU  Anesthesia Type:General  Level of Consciousness: awake, alert  and oriented  Airway & Oxygen Therapy: Patient Spontanous Breathing and Patient connected to face mask oxygen  Post-op Assessment: Report given to RN and Post -op Vital signs reviewed and stable  Post vital signs: Reviewed and stable  Last Vitals:  Filed Vitals:   06/10/15 1049 06/10/15 1256  BP: 148/86 161/73  Pulse: 97 129  Temp: 36.6 C 37.3 C  Resp: 16 18    Complications: No apparent anesthesia complications

## 2015-06-10 NOTE — Anesthesia Preprocedure Evaluation (Signed)
Anesthesia Evaluation  Patient identified by MRN, date of birth, ID band Patient awake    Reviewed: Allergy & Precautions, NPO status , Patient's Chart, lab work & pertinent test results, reviewed documented beta blocker date and time   Airway Mallampati: II  TM Distance: >3 FB     Dental  (+) Chipped   Pulmonary           Cardiovascular hypertension,      Neuro/Psych    GI/Hepatic   Endo/Other    Renal/GU      Musculoskeletal   Abdominal   Peds  Hematology   Anesthesia Other Findings   Reproductive/Obstetrics                             Anesthesia Physical Anesthesia Plan  ASA: III  Anesthesia Plan: General   Post-op Pain Management:    Induction: Intravenous  Airway Management Planned: Oral ETT  Additional Equipment:   Intra-op Plan:   Post-operative Plan:   Informed Consent: I have reviewed the patients History and Physical, chart, labs and discussed the procedure including the risks, benefits and alternatives for the proposed anesthesia with the patient or authorized representative who has indicated his/her understanding and acceptance.     Plan Discussed with: CRNA  Anesthesia Plan Comments:         Anesthesia Quick Evaluation

## 2015-06-11 ENCOUNTER — Encounter: Payer: Self-pay | Admitting: Surgery

## 2015-06-11 MED ORDER — CYCLOBENZAPRINE HCL 10 MG PO TABS: 10.0000 mg | ORAL_TABLET | Freq: Three times a day (TID) | ORAL | Status: DC

## 2015-06-11 MED ORDER — HYDROCODONE-ACETAMINOPHEN 5-325 MG PO TABS: 1.0000 | ORAL_TABLET | ORAL | Status: DC | PRN

## 2015-06-12 NOTE — Discharge Summary (Signed)
Physician Discharge Summary  Patient ID: Eric CarpenCasey L Catino MRN: [id number] DOB/AGE: 1985/10/20 30 y.o.  Admit date: 06/08/2015 Discharge date: 06/11/2015  Admission Diagnoses: Acute cholecystitis  Discharge Diagnoses:  Active Problems:   Cholecystitis with cholelithiasis   Acute cholecystitis   Discharged Condition: good  Hospital Course: 30 yr old with acute cholecystitis.  Patient had Lap chole on 3/9.  Patient did well able to eat regular diet, pain well controlled with po meds, he was up and walking around.    Consults: None  Significant Diagnostic Studies: CT scan  Treatments: antibiotics: Zosyn and surgery: Lap chole  Discharge Exam: Blood pressure 127/65, pulse 78, temperature 98.7 F (37.1 C), temperature source Oral, resp. rate 21, height 5\' 8"  (1.727 m), weight 195 lb (88.451 kg), SpO2 99 %. General appearance: alert, cooperative and no distress GI: soft, appropriately tender, incisions c/d/i Extremities: extremities normal, atraumatic, no cyanosis or edema  Disposition: 01-Home or Self Care  Discharge Instructions    Call MD for:  persistant nausea and vomiting    Complete by:  As directed      Call MD for:  redness, tenderness, or signs of infection (pain, swelling, redness, odor or green/yellow discharge around incision site)    Complete by:  As directed      Call MD for:  severe uncontrolled pain    Complete by:  As directed      Call MD for:  temperature >100.4    Complete by:  As directed      Diet general    Complete by:  As directed      Driving Restrictions    Complete by:  As directed   No driving while on prescription pain medication     Increase activity slowly    Complete by:  As directed      Lifting restrictions    Complete by:  As directed   No lifting over 15lbs for 3 weeks     May shower / Bathe    Complete by:  As directed      No dressing needed    Complete by:  As directed             Medication List    TAKE these medications         cyclobenzaprine 10 MG tablet  Commonly known as:  FLEXERIL  Take 1 tablet (10 mg total) by mouth 3 (three) times daily.     HYDROcodone-acetaminophen 5-325 MG tablet  Commonly known as:  NORCO/VICODIN  Take 1-2 tablets by mouth every 4 (four) hours as needed for moderate pain.     XANAX PO  Take by mouth.     ZOLOFT PO  Take by mouth.           Follow-up Information    Follow up with Gladis Riffleatherine L Vale Mousseau, MD. Go on 06/30/2015.   Specialty:  Surgery   Why:  You have a follow up 06/30/15 at 9:15am.   Contact information:   [address] [place] [id number] [phone]       Signed: Gladis RiffleCatherine L Andric Kerce 06/12/2015, 7:17 AM

## 2015-06-13 LAB — SURGICAL PATHOLOGY

## 2015-06-21 ENCOUNTER — Emergency Department
Admission: EM | Admit: 2015-06-21 | Discharge: 2015-06-21 | Disposition: A | Discharge status: Home / Self Care | Payer: Self-pay | Attending: Emergency Medicine | Admitting: Emergency Medicine

## 2015-06-21 ENCOUNTER — Telehealth: Payer: Self-pay | Admitting: Surgery

## 2015-06-21 ENCOUNTER — Emergency Department: Payer: Self-pay

## 2015-06-21 DIAGNOSIS — R079 Chest pain, unspecified: Secondary | ICD-10-CM | POA: Insufficient documentation

## 2015-06-21 DIAGNOSIS — Z7722 Contact with and (suspected) exposure to environmental tobacco smoke (acute) (chronic): Secondary | ICD-10-CM | POA: Insufficient documentation

## 2015-06-21 DIAGNOSIS — I10 Essential (primary) hypertension: Secondary | ICD-10-CM | POA: Insufficient documentation

## 2015-06-21 DIAGNOSIS — F339 Major depressive disorder, recurrent, unspecified: Secondary | ICD-10-CM | POA: Insufficient documentation

## 2015-06-21 LAB — COMPREHENSIVE METABOLIC PANEL
ALBUMIN: 4.2 g/dL (ref 3.5–5.0)
ALK PHOS: 72 U/L (ref 38–126)
ALT: 29 U/L (ref 17–63)
ANION GAP: 9 (ref 5–15)
AST: 28 U/L (ref 15–41)
BILIRUBIN TOTAL: 0.5 mg/dL (ref 0.3–1.2)
BUN: 7 mg/dL (ref 6–20)
CALCIUM: 9.2 mg/dL (ref 8.9–10.3)
CO2: 26 mmol/L (ref 22–32)
Chloride: 100 mmol/L — ABNORMAL LOW (ref 101–111)
Creatinine, Ser: 1 mg/dL (ref 0.61–1.24)
GFR calc Af Amer: >60 mL/min (ref >60)
GFR calc non Af Amer: >60 mL/min (ref >60)
GLUCOSE: 98 mg/dL (ref 65–99)
Potassium: 3.7 mmol/L (ref 3.5–5.1)
SODIUM: 135 mmol/L (ref 135–145)
TOTAL PROTEIN: 8.3 g/dL — AB (ref 6.5–8.1)

## 2015-06-21 LAB — CBC
HEMATOCRIT: 41.4 % (ref 40.0–52.0)
HEMOGLOBIN: 14.1 g/dL (ref 13.0–18.0)
MCH: 28.3 pg (ref 26.0–34.0)
MCHC: 34 g/dL (ref 32.0–36.0)
MCV: 83.2 fL (ref 80.0–100.0)
Platelets: 276 10*3/uL (ref 150–440)
RBC: 4.97 MIL/uL (ref 4.40–5.90)
RDW: 13.5 % (ref 11.5–14.5)
WBC: 6.9 10*3/uL (ref 3.8–10.6)

## 2015-06-21 LAB — URINALYSIS COMPLETE WITH MICROSCOPIC (ARMC ONLY)
BACTERIA UA: NONE SEEN
BILIRUBIN URINE: NEGATIVE
GLUCOSE, UA: NEGATIVE mg/dL
Ketones, ur: NEGATIVE mg/dL
Leukocytes, UA: NEGATIVE
NITRITE: NEGATIVE
Protein, ur: NEGATIVE mg/dL
SPECIFIC GRAVITY, URINE: 1.009 (ref 1.005–1.030)
SQUAMOUS EPITHELIAL / LPF: NONE SEEN
pH: 7 (ref 5.0–8.0)

## 2015-06-21 LAB — LIPASE, BLOOD: LIPASE: 27 U/L (ref 11–51)

## 2015-06-21 LAB — TROPONIN I: Troponin I: <0.03 ng/mL (ref <0.031)

## 2015-06-21 MED ORDER — PANTOPRAZOLE SODIUM 20 MG PO TBEC: 20.0000 mg | DELAYED_RELEASE_TABLET | Freq: Every day | ORAL | Status: DC

## 2015-06-21 MED ORDER — ACETAMINOPHEN 500 MG PO TABS
1000.0000 mg | ORAL_TABLET | Freq: Once | ORAL | Status: AC
  Administered 2015-06-21: 1000 mg via ORAL
  Filled 2015-06-21: qty 2

## 2015-06-21 NOTE — Telephone Encounter (Signed)
Scheduled appointment for patient.

## 2015-06-21 NOTE — ED Notes (Signed)

## 2015-06-21 NOTE — Telephone Encounter (Signed)
Patient will need to see Physician. Please move up patient's post-op appointment to tomorrow with Dr. Everlene FarrierPabon so that we can discuss this and examine patient.

## 2015-06-21 NOTE — ED Notes (Addendum)
Pt in with co chest pain and shob since today, also co n.v.d. Since today, no abd pain had gallbladder removed 3/9.

## 2015-06-21 NOTE — ED Notes (Signed)
Pt c/o chest pressure, shortness of breath, N/V/D since "earlier today." Pt reports chest pressure, "like someone is sitting on my chest." Pt had gallbladder remover 3/09. Pt denies abdominal pain or fever at home. Pt alert and oriented x 4, no increased work in breathing. Skin warm and dry.

## 2015-06-21 NOTE — Discharge Instructions (Signed)
Nonspecific Chest Pain °It is often hard to find the cause of chest pain. There is always a chance that your pain could be related to something serious, such as a heart attack or a blood clot in your lungs. Chest pain can also be caused by conditions that are not life-threatening. If you have chest pain, it is very important to follow up with your doctor. ° °HOME CARE °· If you were prescribed an antibiotic medicine, finish it all even if you start to feel better. °· Avoid any activities that cause chest pain. °· Do not use any tobacco products, including cigarettes, chewing tobacco, or electronic cigarettes. If you need help quitting, ask your doctor. °· Do not drink alcohol. °· Take medicines only as told by your doctor. °· Keep all follow-up visits as told by your doctor. This is important. This includes any further testing if your chest pain does not go away. °· Your doctor may tell you to keep your head raised (elevated) while you sleep. °· Make lifestyle changes as told by your doctor. These may include: °¨ Getting regular exercise. Ask your doctor to suggest some activities that are safe for you. °¨ Eating a heart-healthy diet. Your doctor or a diet specialist (dietitian) can help you to learn healthy eating options. °¨ Maintaining a healthy weight. °¨ Managing diabetes, if necessary. °¨ Reducing stress. °GET HELP IF: °· Your chest pain does not go away, even after treatment. °· You have a rash with blisters on your chest. °· You have a fever. °GET HELP RIGHT AWAY IF: °· Your chest pain is worse. °· You have an increasing cough, or you cough up blood. °· You have severe belly (abdominal) pain. °· You feel extremely weak. °· You pass out (faint). °· You have chills. °· You have sudden, unexplained chest discomfort. °· You have sudden, unexplained discomfort in your arms, back, neck, or jaw. °· You have shortness of breath at any time. °· You suddenly start to sweat, or your skin gets clammy. °· You feel  nauseous. °· You vomit. °· You suddenly feel light-headed or dizzy. °· Your heart begins to beat quickly, or it feels like it is skipping beats. °These symptoms may be an emergency. Do not wait to see if the symptoms will go away. Get medical help right away. Call your local emergency services (911 in the [place] Do not drive yourself to the hospital. °  °This information is not intended to replace advice given to you by your health care provider. Make sure you discuss any questions you have with your health care provider. °  °Document Released: 09/06/2007 Document Revised: 04/10/2014 Document Reviewed: 10/24/2013 °Elsevier Interactive Patient Education ©2016 [employer] ° °Please return immediately if condition worsens. Please contact her primary physician or the physician you were given for referral. If you have any specialist physicians involved in her treatment and plan please also contact them. Thank you for using [facility]. ° °

## 2015-06-21 NOTE — Telephone Encounter (Signed)
Patient is out of pain medication since his surgery. His mother called and wanted to know if he could get a refill?
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
## 2015-06-22 ENCOUNTER — Ambulatory Visit (INDEPENDENT_AMBULATORY_CARE_PROVIDER_SITE_OTHER): Payer: Self-pay | Admitting: Surgery

## 2015-06-22 ENCOUNTER — Inpatient Hospital Stay
Admission: EM | Admit: 2015-06-22 | Discharge: 2015-07-02 | DRG: 357 | Disposition: A | Discharge status: Home / Self Care | Payer: Self-pay | Attending: Surgery | Admitting: Surgery

## 2015-06-22 ENCOUNTER — Encounter: Payer: Self-pay | Admitting: *Deleted

## 2015-06-22 ENCOUNTER — Encounter: Payer: Self-pay | Admitting: Surgery

## 2015-06-22 ENCOUNTER — Ambulatory Visit
Admission: RE | Admit: 2015-06-22 | Discharge: 2015-06-22 | Disposition: A | Discharge status: Home / Self Care | Payer: Self-pay | Source: Ambulatory Visit | Attending: Surgery | Admitting: Surgery

## 2015-06-22 VITALS — BP 135/75 | HR 64 | Temp 98.4°F | Ht 68.0 in | Wt 188.0 lb

## 2015-06-22 DIAGNOSIS — F339 Major depressive disorder, recurrent, unspecified: Secondary | ICD-10-CM | POA: Diagnosis present

## 2015-06-22 DIAGNOSIS — Z8249 Family history of ischemic heart disease and other diseases of the circulatory system: Secondary | ICD-10-CM

## 2015-06-22 DIAGNOSIS — F411 Generalized anxiety disorder: Secondary | ICD-10-CM | POA: Diagnosis present

## 2015-06-22 DIAGNOSIS — R198 Other specified symptoms and signs involving the digestive system and abdomen: Secondary | ICD-10-CM

## 2015-06-22 DIAGNOSIS — Z9049 Acquired absence of other specified parts of digestive tract: Secondary | ICD-10-CM

## 2015-06-22 DIAGNOSIS — E669 Obesity, unspecified: Secondary | ICD-10-CM | POA: Diagnosis present

## 2015-06-22 DIAGNOSIS — R1084 Generalized abdominal pain: Secondary | ICD-10-CM

## 2015-06-22 DIAGNOSIS — E440 Moderate protein-calorie malnutrition: Secondary | ICD-10-CM | POA: Insufficient documentation

## 2015-06-22 DIAGNOSIS — K631 Perforation of intestine (nontraumatic): Secondary | ICD-10-CM

## 2015-06-22 DIAGNOSIS — Z9889 Other specified postprocedural states: Secondary | ICD-10-CM | POA: Insufficient documentation

## 2015-06-22 DIAGNOSIS — K668 Other specified disorders of peritoneum: Secondary | ICD-10-CM | POA: Diagnosis present

## 2015-06-22 DIAGNOSIS — K2980 Duodenitis without bleeding: Principal | ICD-10-CM | POA: Diagnosis present

## 2015-06-22 DIAGNOSIS — R109 Unspecified abdominal pain: Secondary | ICD-10-CM

## 2015-06-22 DIAGNOSIS — Z5331 Laparoscopic surgical procedure converted to open procedure: Secondary | ICD-10-CM

## 2015-06-22 DIAGNOSIS — Z6829 Body mass index (BMI) 29.0-29.9, adult: Secondary | ICD-10-CM

## 2015-06-22 LAB — CBC WITH DIFFERENTIAL/PLATELET
Basophils Absolute: 0.1 10*3/uL (ref 0–0.1)
Basophils Relative: 1 %
Eosinophils Absolute: 0.3 10*3/uL (ref 0–0.7)
Eosinophils Relative: 6 %
HCT: 38.2 % — ABNORMAL LOW (ref 40.0–52.0)
Hemoglobin: 13.2 g/dL (ref 13.0–18.0)
Lymphocytes Relative: 34 %
Lymphs Abs: 2 10*3/uL (ref 1.0–3.6)
MCH: 28.4 pg (ref 26.0–34.0)
MCHC: 34.5 g/dL (ref 32.0–36.0)
MCV: 82.4 fL (ref 80.0–100.0)
Monocytes Absolute: 0.6 10*3/uL (ref 0.2–1.0)
Monocytes Relative: 10 %
Neutro Abs: 2.9 10*3/uL (ref 1.4–6.5)
Neutrophils Relative %: 49 %
Platelets: 267 10*3/uL (ref 150–440)
RBC: 4.63 MIL/uL (ref 4.40–5.90)
RDW: 13.3 % (ref 11.5–14.5)
WBC: 5.8 10*3/uL (ref 3.8–10.6)

## 2015-06-22 LAB — COMPREHENSIVE METABOLIC PANEL
ALT: 25 U/L (ref 17–63)
AST: 25 U/L (ref 15–41)
Albumin: 4 g/dL (ref 3.5–5.0)
Alkaline Phosphatase: 64 U/L (ref 38–126)
Anion gap: 9 (ref 5–15)
BUN: 8 mg/dL (ref 6–20)
CO2: 24 mmol/L (ref 22–32)
Calcium: 8.9 mg/dL (ref 8.9–10.3)
Chloride: 100 mmol/L — ABNORMAL LOW (ref 101–111)
Creatinine, Ser: 0.78 mg/dL (ref 0.61–1.24)
GFR calc Af Amer: >60 mL/min (ref >60)
GFR calc non Af Amer: >60 mL/min (ref >60)
Glucose, Bld: 91 mg/dL (ref 65–99)
Potassium: 3.7 mmol/L (ref 3.5–5.1)
Sodium: 133 mmol/L — ABNORMAL LOW (ref 135–145)
Total Bilirubin: 0.6 mg/dL (ref 0.3–1.2)
Total Protein: 7.9 g/dL (ref 6.5–8.1)

## 2015-06-22 LAB — LIPASE, BLOOD: LIPASE: 21 U/L (ref 11–51)

## 2015-06-22 MED ORDER — IOHEXOL 300 MG/ML  SOLN
100.0000 mL | Freq: Once | INTRAMUSCULAR | Status: AC | PRN
  Administered 2015-06-22: 100 mL via INTRAVENOUS

## 2015-06-22 MED ORDER — PANTOPRAZOLE SODIUM 40 MG IV SOLR
40.0000 mg | Freq: Two times a day (BID) | INTRAVENOUS | Status: DC
  Filled 2015-06-22: qty 40

## 2015-06-22 MED ORDER — SODIUM CHLORIDE 0.9 % IV SOLN
80.0000 mg | Freq: Once | INTRAVENOUS | Status: AC
  Administered 2015-06-22: 80 mg via INTRAVENOUS
  Filled 2015-06-22: qty 80

## 2015-06-22 MED ORDER — SODIUM CHLORIDE 0.9 % IV BOLUS (SEPSIS)
500.0000 mL | Freq: Once | INTRAVENOUS | Status: AC
  Administered 2015-06-22: 500 mL via INTRAVENOUS

## 2015-06-22 MED ORDER — SERTRALINE HCL 50 MG PO TABS
75.0000 mg | ORAL_TABLET | Freq: Every day | ORAL | Status: DC
  Administered 2015-06-22 – 2015-06-24 (×3): 75 mg via ORAL
  Filled 2015-06-22 (×4): qty 1

## 2015-06-22 MED ORDER — PANTOPRAZOLE SODIUM 40 MG IV SOLR: 40.0000 mg | Freq: Two times a day (BID) | INTRAVENOUS | Status: DC

## 2015-06-22 MED ORDER — ONDANSETRON 4 MG PO TBDP: 4.0000 mg | ORAL_TABLET | ORAL | Status: DC | PRN

## 2015-06-22 MED ORDER — SIMETHICONE 80 MG PO CHEW: 40.0000 mg | CHEWABLE_TABLET | Freq: Four times a day (QID) | ORAL | Status: DC | PRN

## 2015-06-22 MED ORDER — ONDANSETRON HCL 4 MG/2ML IJ SOLN
4.0000 mg | Freq: Four times a day (QID) | INTRAMUSCULAR | Status: DC | PRN
  Administered 2015-06-22: 4 mg via INTRAVENOUS
  Filled 2015-06-22: qty 2

## 2015-06-22 MED ORDER — ALPRAZOLAM 1 MG PO TABS
1.0000 mg | ORAL_TABLET | Freq: Two times a day (BID) | ORAL | Status: DC
  Administered 2015-06-22 – 2015-07-02 (×18): 1 mg via ORAL
  Filled 2015-06-22 (×19): qty 1

## 2015-06-22 MED ORDER — MORPHINE SULFATE (PF) 2 MG/ML IV SOLN
2.0000 mg | INTRAVENOUS | Status: DC | PRN
  Administered 2015-06-22 – 2015-06-23 (×3): 2 mg via INTRAVENOUS
  Filled 2015-06-22 (×3): qty 1

## 2015-06-22 MED ORDER — SODIUM CHLORIDE 0.9 % IV SOLN
8.0000 mg/h | INTRAVENOUS | Status: AC
  Administered 2015-06-22 – 2015-06-24 (×5): 8 mg/h via INTRAVENOUS
  Filled 2015-06-22 (×5): qty 80

## 2015-06-22 MED ORDER — DIPHENHYDRAMINE HCL 12.5 MG/5ML PO ELIX
12.5000 mg | ORAL_SOLUTION | Freq: Four times a day (QID) | ORAL | Status: DC | PRN
  Filled 2015-06-22: qty 5

## 2015-06-22 MED ORDER — ONDANSETRON HCL 4 MG/2ML IJ SOLN
4.0000 mg | INTRAMUSCULAR | Status: DC | PRN
  Administered 2015-06-25: 4 mg via INTRAVENOUS
  Filled 2015-06-22: qty 2

## 2015-06-22 MED ORDER — ONDANSETRON 4 MG PO TBDP: 4.0000 mg | ORAL_TABLET | Freq: Four times a day (QID) | ORAL | Status: DC | PRN

## 2015-06-22 MED ORDER — DIPHENHYDRAMINE HCL 50 MG/ML IJ SOLN: 12.5000 mg | Freq: Four times a day (QID) | INTRAMUSCULAR | Status: DC | PRN

## 2015-06-22 MED ORDER — OXYCODONE HCL 5 MG PO TABS
5.0000 mg | ORAL_TABLET | ORAL | Status: DC | PRN
  Administered 2015-06-23: 06:00:00 10 mg via ORAL
  Administered 2015-06-23: 5 mg via ORAL
  Filled 2015-06-22: qty 2
  Filled 2015-06-22: qty 1

## 2015-06-22 MED ORDER — ENOXAPARIN SODIUM 40 MG/0.4ML ~~LOC~~ SOLN
40.0000 mg | SUBCUTANEOUS | Status: DC
  Administered 2015-06-22 – 2015-07-01 (×9): 40 mg via SUBCUTANEOUS
  Filled 2015-06-22 (×9): qty 0.4

## 2015-06-22 MED ORDER — ONDANSETRON HCL 4 MG/2ML IJ SOLN
4.0000 mg | Freq: Once | INTRAMUSCULAR | Status: AC
  Administered 2015-06-22: 4 mg via INTRAVENOUS
  Filled 2015-06-22: qty 2

## 2015-06-22 MED ORDER — DEXTROSE IN LACTATED RINGERS 5 % IV SOLN
INTRAVENOUS | Status: DC
  Administered 2015-06-22 – 2015-06-25 (×5): via INTRAVENOUS
  Filled 2015-06-22 (×3): qty 1000

## 2015-06-22 MED ORDER — MORPHINE SULFATE (PF) 4 MG/ML IV SOLN
4.0000 mg | Freq: Once | INTRAVENOUS | Status: AC
Start: 2015-06-22 — End: 2015-06-22
  Administered 2015-06-22: 4 mg via INTRAVENOUS
  Filled 2015-06-22: qty 1
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
## 2015-06-22 NOTE — Progress Notes (Signed)
Eric Dalton  is 30 year old status post laparoscopic cholecystectomy by Dr. Orvis BrillLoflin  12 days ago for acute cholecystitis. Now he presented last night to the emergency room complaining of increased abdominal pain nausea and chest pain. Workup in the ER revealed normal EKG his white count was normal and a chest x-ray shows some free air below the diaphragm. This is a little bit unusual 10 days out from a laparoscopic cholecystectomy. He has not been running a fever and his being in no acute distress able to tolerate meals  PE: No acute distress awake alert he walks without difficulty. Afebrile heart rate is in the 80s Abd: Sessions healing well abdomen is soft is very mild diffuse tenderness no evidence of peritonitis.  A/P current abdominal pain differential will certainly include constipation versus complication related to the cholecystectomy. I am concerned however about the persistent free air 10 days after surgery and I will order a CT scan of the abdomen and pelvis to rule out any hollow viscus perforation. I do not think he is in any distress and he does not need any immediate surgical intervention or admission to the hospital. We'll make sure that he follows up with us tomorrow.
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
## 2015-06-22 NOTE — ED Notes (Signed)
Pt reports he has abd pain.  Pt had outpt ctscan of abd and sent to er for admission.  Pt denies n/v/d. Pt alert.

## 2015-06-23 MED ORDER — OXYCODONE HCL 5 MG PO TABS
10.0000 mg | ORAL_TABLET | ORAL | Status: DC | PRN
  Administered 2015-06-23 – 2015-06-24 (×6): 10 mg via ORAL
  Filled 2015-06-23 (×6): qty 2

## 2015-06-23 MED ORDER — MORPHINE SULFATE (PF) 2 MG/ML IV SOLN
2.0000 mg | INTRAVENOUS | Status: DC | PRN
  Administered 2015-06-24 – 2015-06-26 (×4): 2 mg via INTRAVENOUS
  Filled 2015-06-23 (×2): qty 1

## 2015-06-23 NOTE — Progress Notes (Signed)
Patient seen up and walking. History reviewed with Dr. Orvis BrillLoflin and with Dr. Everlene FarrierPabon patient states that with pain medication he has almost no pain but that returns in between medications.  We'll examine later today and discuss with Dr. Orvis BrillLoflin.

## 2015-06-23 NOTE — Progress Notes (Signed)
Patient seen in room. He states she's having pain in between injections of analgesics. He's had no nausea vomiting no fevers or chills.  Vital signs are stable and he is afebrile. He appears comfortable in general. He moves about the room in the bed quite easily without difficulty. Abdomen is soft nondistended nontympanitic and only minimally tender without guarding or rebound tenderness.  Unclear etiology the patient's free air and abdominal pain I will discuss and deferred to Dr. Orvis BrillLoflin

## 2015-06-24 LAB — H PYLORI, IGM, IGG, IGA AB: H. Pylogi, Iga Abs: <9 units (ref 0.0–8.9)

## 2015-06-24 MED ORDER — PANTOPRAZOLE SODIUM 40 MG IV SOLR
40.0000 mg | Freq: Two times a day (BID) | INTRAVENOUS | Status: DC
  Administered 2015-06-26 – 2015-07-02 (×12): 40 mg via INTRAVENOUS
  Filled 2015-06-24 (×13): qty 40

## 2015-06-24 MED ORDER — SODIUM CHLORIDE 0.9 % IV SOLN
8.0000 mg/h | INTRAVENOUS | Status: DC
  Administered 2015-06-24 – 2015-06-25 (×2): 8 mg/h via INTRAVENOUS
  Filled 2015-06-24: qty 80

## 2015-06-24 NOTE — Progress Notes (Signed)
Subjective: Patient admitted for free air under the diaphragm 2 weeks following laparoscopic cholecystectomy with the acute onset of abdominal pain. The working diagnosis for this has been peptic ulcer disease but there is no clear-cut evidence of that. I had originally suggested laparoscopy by Dr. Orvis BrillLoflin suggested IV antibiotics and conservative management in this patient who is minimally symptomatic afebrile with a normal white count.  Today the patient states he is no better but no worse he is continuing to have abdominal pain and is taking Roxicodone for his pain and a regular basis.Marland Kitchen. He has been placed on a soft diet and is currently eating mashed potatoes and meatloaf.  Objective: Vital signs in last 24 hours: Temp:  [97.4 F (36.3 C)-97.7 F (36.5 C)] 97.7 F (36.5 C) (03/23 1343) Pulse Rate:  [57-90] 90 (03/23 1343) Resp:  [18-20] 20 (03/23 1343) BP: (107-142)/(59-78) 122/78 mmHg (03/23 1343) SpO2:  [98 %-100 %] 100 % (03/23 1343) Last BM Date: 06/21/15  Intake/Output from previous day: 03/22 0701 - 03/23 0700 In: 360 [P.O.:360] Out: 1200 [Urine:1200] Intake/Output this shift:    Physical exam:  Vital signs are stable he is afebrile. In general he appears comfortable. Awake alert and oriented Abdomen with Dermabond on the wounds without erythema or drainage abdomen is nondistended nontympanitic but diffusely tender some minimal percussion tenderness. Calves are nontender  Lab Results: CBC   Recent Labs  06/21/15 1933 06/22/15 2011  WBC 6.9 5.8  HGB 14.1 13.2  HCT 41.4 38.2*  PLT 276 267   BMET  Recent Labs  06/21/15 1933 06/22/15 2011  NA 135 133*  K 3.7 3.7  CL 100* 100*  CO2 26 24  GLUCOSE 98 91  BUN 7 8  CREATININE 1.00 0.78  CALCIUM 9.2 8.9   PT/INR No results for input(s): LABPROT, INR in the last 72 hours. ABG No results for input(s): PHART, HCO3 in the last 72 hours.  Invalid input(s): PCO2, PO2  Studies/Results: Ct Abdomen Pelvis W  Contrast  06/22/2015  CLINICAL DATA:  Severe abdominal pain for the last 2 days, patient is 12 days status post cholecystectomy EXAM: CT ABDOMEN AND PELVIS WITH CONTRAST TECHNIQUE: Multidetector CT imaging of the abdomen and pelvis was performed using the standard protocol following bolus administration of intravenous contrast. CONTRAST:  100mL OMNIPAQUE IOHEXOL 300 MG/ML  SOLN COMPARISON:  06/08/2015 FINDINGS: Lower chest:  Negative Hepatobiliary: Liver is normal. There are clips in the gallbladder fossa with trace residual postoperative fluid in the gallbladder fossa. Pancreas: Negative Spleen: Spleen is normal Adrenals/Urinary Tract: Kidneys and adrenal glands are normal. Mild diffuse bladder wall thickening stable. Stomach/Bowel: Diverticulosis sigmoid colon. No evidence of diverticulitis. Nonobstructive gas pattern. Stomach and small bowel appear normal. Vascular/Lymphatic: No vascular abnormalities. No significant adenopathy. Reproductive: Negative Other: Moderate volume of pneumoperitoneum. This is seen primarily anteriorly over the upper abdomen, but foci of pneumoperitoneum are seen in the gallbladder fossa, porta hepatis, gastrohepatic ligament, as well as anteriorly in the inferior abdomen and pelvis. There is subcutaneous collection of air in fluid measuring 23 x 36 mm in the retro umbilical region. Musculoskeletal: No acute musculoskeletal findings IMPRESSION: Excessive pneumoperitoneum for a patient that is 12 days postop, concerning for bowel perforation, but without any further evidence to support this. Additionally, anticipated tiny postoperative fluid collection in the gallbladder fossa and small subcutaneous periumbilical postoperative focus of air and fluid. Critical Value/emergent results were called by telephone at the time of interpretation on 06/22/2015 at 6:53 pm to Dr. Sterling BigIEGO PABON ,  who verbally acknowledged these results. Electronically Signed   By: Esperanza Heir M.D.   On: 06/22/2015  18:53    Anti-infectives: Anti-infectives    None      Assessment/Plan: s/p     With free air under the diaphragm following laparoscopic cholecystectomy this is of unclear etiology. He is being treated as if this was peptic ulcer disease and has been placed on a soft diet and on PPI. Is my understanding that Dr. Elease Etienne plan is to keep the patient on the PPI and reexamine should he worsen he may require laparoscopy.  Lattie Haw, MD, FACS  06/24/2015

## 2015-06-24 NOTE — Progress Notes (Signed)
Called by Charge nurse, patient very anxious as well as mother because he was made NPO after midnight.  Spoke with patient in room, he complained that my partner "only spent 1 min in the room." Patient stated that he was asked if he was still having pain, examined his abdomen and then did not explain anything else.  Patient confused as to why he would be NPO and concerned about having an operation without someone talking to him.   I apologized to the patient for the confusion and spoke with the sister, Seward GraterDeane Green, who is a nurse over the phone while in the room.  The patient states that his pain is about the same, that the meds by mouth help to keep it tolerable.  He states he has been up in the room, walking around without worsening.  Patient tolerated soft diet today, without nausea, he states that the pain is not worse with eating.    Filed Vitals:   06/24/15 1343 06/24/15 2119  BP: 122/78 141/82  Pulse: 90 68  Temp: 97.7 F (36.5 C) 97.4 F (36.3 C)  Resp: 20 18   PE:  Gen: NAD Abd: soft, mild tenderness in epigastrium and some in right upper quadrant, NO tenderness in LLQ, LUQ or RLQ, no defuse tenderness, no rebound or guarding   A/P 30 yr old POD#14 from Lap chole, pain starting in epigastrium on POD#10, was improving before, with free air on the CT scan without inflammation or evidence of perforation, working diagnosis of perforated ulcer from pain medication, given presentation.  Patient not peritoneal, pain remaining the same but not worsening, tolerating diet well, no need for emergent surgical intervention.  I did discuss with patient that should he not see improvement in the next 48-72 or should he worsen clinically or by laboratory values, that a diagnostic laparoscopy may be helpful.  Patient and family understood and appreciative of time spent explaining patients care.  Will continue PPI, will keep him NPO after midnight and will discuss with my partner tomorrow, if there are signs  of worsening or increase in WBC may need to operate.

## 2015-06-25 ENCOUNTER — Inpatient Hospital Stay: Payer: Self-pay

## 2015-06-25 ENCOUNTER — Inpatient Hospital Stay: Payer: MEDICAID

## 2015-06-25 ENCOUNTER — Encounter: Payer: Self-pay | Admitting: Radiology

## 2015-06-25 ENCOUNTER — Inpatient Hospital Stay: Payer: MEDICAID | Admitting: Anesthesiology

## 2015-06-25 ENCOUNTER — Encounter
Admission: EM | Disposition: A | Discharge status: Home / Self Care | Payer: Self-pay | Source: Home / Self Care | Attending: Surgery

## 2015-06-25 DIAGNOSIS — K668 Other specified disorders of peritoneum: Secondary | ICD-10-CM | POA: Insufficient documentation

## 2015-06-25 HISTORY — PX: LAPAROSCOPY: SHX197

## 2015-06-25 LAB — CREATININE, SERUM
CREATININE: 0.88 mg/dL (ref 0.61–1.24)
GFR calc Af Amer: >60 mL/min (ref >60)
GFR calc non Af Amer: >60 mL/min (ref >60)

## 2015-06-25 LAB — CBC
HEMATOCRIT: 34.9 % — AB (ref 40.0–52.0)
HEMOGLOBIN: 11.9 g/dL — AB (ref 13.0–18.0)
MCH: 28.1 pg (ref 26.0–34.0)
MCHC: 34 g/dL (ref 32.0–36.0)
MCV: 82.8 fL (ref 80.0–100.0)
Platelets: 239 10*3/uL (ref 150–440)
RBC: 4.22 MIL/uL — AB (ref 4.40–5.90)
RDW: 13.2 % (ref 11.5–14.5)
WBC: 5.7 10*3/uL (ref 3.8–10.6)

## 2015-06-25 SURGERY — LAPAROSCOPY, DIAGNOSTIC
Anesthesia: General | Wound class: Clean

## 2015-06-25 MED ORDER — SODIUM CHLORIDE 0.9 % IJ SOLN
INTRAMUSCULAR | Status: AC
  Administered 2015-06-26: 01:00:00
  Filled 2015-06-25: qty 10

## 2015-06-25 MED ORDER — MIDAZOLAM HCL 2 MG/2ML IJ SOLN
INTRAMUSCULAR | Status: DC | PRN
  Administered 2015-06-25: 2 mg via INTRAVENOUS

## 2015-06-25 MED ORDER — IOPAMIDOL (ISOVUE-300) INJECTION 61%
100.0000 mL | Freq: Once | INTRAVENOUS | Status: AC | PRN
  Administered 2015-06-25: 15:00:00 100 mL via INTRAVENOUS

## 2015-06-25 MED ORDER — ONDANSETRON HCL 4 MG/2ML IJ SOLN
INTRAMUSCULAR | Status: DC | PRN
  Administered 2015-06-25: 4 mg via INTRAVENOUS

## 2015-06-25 MED ORDER — CEFAZOLIN SODIUM-DEXTROSE 2-3 GM-% IV SOLR
INTRAVENOUS | Status: DC | PRN
  Administered 2015-06-25: 2 g via INTRAVENOUS

## 2015-06-25 MED ORDER — GLYCOPYRROLATE 0.2 MG/ML IJ SOLN
INTRAMUSCULAR | Status: DC | PRN
  Administered 2015-06-25: 0.2 mg via INTRAVENOUS

## 2015-06-25 MED ORDER — LIDOCAINE HCL (CARDIAC) 20 MG/ML IV SOLN
INTRAVENOUS | Status: DC | PRN
  Administered 2015-06-25: 60 mg via INTRAVENOUS

## 2015-06-25 MED ORDER — SUGAMMADEX SODIUM 200 MG/2ML IV SOLN
INTRAVENOUS | Status: DC | PRN
  Administered 2015-06-25: 160 mg via INTRAVENOUS

## 2015-06-25 MED ORDER — FENTANYL CITRATE (PF) 100 MCG/2ML IJ SOLN
INTRAMUSCULAR | Status: DC | PRN
  Administered 2015-06-25 (×4): 50 ug via INTRAVENOUS

## 2015-06-25 MED ORDER — DIATRIZOATE MEGLUMINE & SODIUM 66-10 % PO SOLN
15.0000 mL | ORAL | Status: AC
  Administered 2015-06-25 (×2): 15 mL via ORAL

## 2015-06-25 MED ORDER — BUPIVACAINE-EPINEPHRINE 0.25% -1:200000 IJ SOLN
INTRAMUSCULAR | Status: DC | PRN
  Administered 2015-06-25: 30 mL

## 2015-06-25 MED ORDER — PROMETHAZINE HCL 25 MG/ML IJ SOLN
6.2500 mg | INTRAMUSCULAR | Status: DC | PRN
  Administered 2015-06-25: 12.5 mg via INTRAVENOUS

## 2015-06-25 MED ORDER — DEXTROSE IN LACTATED RINGERS 5 % IV SOLN
INTRAVENOUS | Status: DC
  Administered 2015-06-26 – 2015-06-30 (×10): via INTRAVENOUS

## 2015-06-25 MED ORDER — DEXAMETHASONE SODIUM PHOSPHATE 10 MG/ML IJ SOLN
INTRAMUSCULAR | Status: DC | PRN
  Administered 2015-06-25: 8 mg via INTRAVENOUS

## 2015-06-25 MED ORDER — FENTANYL CITRATE (PF) 100 MCG/2ML IJ SOLN: 25.0000 ug | INTRAMUSCULAR | Status: DC | PRN

## 2015-06-25 MED ORDER — METHYLENE BLUE 0.5 % INJ SOLN
INTRAVENOUS | Status: DC | PRN
  Administered 2015-06-25: 10 mL

## 2015-06-25 MED ORDER — PROMETHAZINE HCL 25 MG/ML IJ SOLN
INTRAMUSCULAR | Status: AC
  Administered 2015-06-25: 12.5 mg via INTRAVENOUS
  Filled 2015-06-25: qty 1

## 2015-06-25 MED ORDER — METRONIDAZOLE IN NACL 5-0.79 MG/ML-% IV SOLN
500.0000 mg | Freq: Once | INTRAVENOUS | Status: AC
  Administered 2015-06-25: 500 mg via INTRAVENOUS
  Filled 2015-06-25: qty 100

## 2015-06-25 MED ORDER — ROCURONIUM BROMIDE 100 MG/10ML IV SOLN
INTRAVENOUS | Status: DC | PRN
  Administered 2015-06-25 (×2): 20 mg via INTRAVENOUS
  Administered 2015-06-25 (×2): 10 mg via INTRAVENOUS
  Administered 2015-06-25: 40 mg via INTRAVENOUS

## 2015-06-25 MED ORDER — PROPOFOL 10 MG/ML IV BOLUS
INTRAVENOUS | Status: DC | PRN
  Administered 2015-06-25: 200 mg via INTRAVENOUS

## 2015-06-25 MED ORDER — LACTATED RINGERS IV SOLN
INTRAVENOUS | Status: DC | PRN
  Administered 2015-06-25 (×2): via INTRAVENOUS

## 2015-06-25 MED ORDER — MORPHINE SULFATE (PF) 2 MG/ML IV SOLN
2.0000 mg | INTRAVENOUS | Status: DC | PRN
  Administered 2015-06-25 (×3): 2 mg via INTRAVENOUS
  Filled 2015-06-25 (×3): qty 1

## 2015-06-25 MED ORDER — DIATRIZOATE MEGLUMINE & SODIUM 66-10 % PO SOLN: 15.0000 mL | Freq: Once | ORAL | Status: DC

## 2015-06-25 MED ORDER — METRONIDAZOLE IN NACL 5-0.79 MG/ML-% IV SOLN
INTRAVENOUS | Status: DC | PRN
  Administered 2015-06-25: 500 mg via INTRAVENOUS

## 2015-06-25 MED ORDER — SUCCINYLCHOLINE CHLORIDE 20 MG/ML IJ SOLN
INTRAMUSCULAR | Status: DC | PRN
  Administered 2015-06-25: 100 mg via INTRAVENOUS

## 2015-06-25 SURGICAL SUPPLY — 63 items
APPLICATOR COTTON TIP 6IN STRL (MISCELLANEOUS) ×2 IMPLANT
APPLIER CLIP 5 13 M/L LIGAMAX5 (MISCELLANEOUS)
APR CLP MED LRG 5 ANG JAW (MISCELLANEOUS)
CANISTER SUCT 1200ML W/VALVE (MISCELLANEOUS) ×3 IMPLANT
CHLORAPREP W/TINT 26ML (MISCELLANEOUS) ×3 IMPLANT
CHOLANGIOGRAM CATH TAUT (CATHETERS) IMPLANT
CLEANER CAUTERY TIP 5X5 PAD (MISCELLANEOUS) ×1 IMPLANT
CLIP APPLIE 5 13 M/L LIGAMAX5 (MISCELLANEOUS) ×1 IMPLANT
CNTNR SPEC 2.5X3XGRAD LEK (MISCELLANEOUS) ×1
CONT SPEC 4OZ STER OR WHT (MISCELLANEOUS) ×2
CONT SPEC 4OZ STRL OR WHT (MISCELLANEOUS) ×1
CONTAINER SPEC 2.5X3XGRAD LEK (MISCELLANEOUS) IMPLANT
DECANTER SPIKE VIAL GLASS SM (MISCELLANEOUS) ×4 IMPLANT
DEVICE TROCAR PUNCTURE CLOSURE (ENDOMECHANICALS) IMPLANT
DRAIN CHANNEL JP 19F (MISCELLANEOUS) ×4 IMPLANT
DRAPE C-ARM XRAY 36X54 (DRAPES) ×1 IMPLANT
DRAPE TABLE BACK 80X90 (DRAPES) ×2 IMPLANT
DRSG OPSITE POSTOP 4X12 (GAUZE/BANDAGES/DRESSINGS) ×2 IMPLANT
ELECT BLADE 6.5 EXT (BLADE) ×2 IMPLANT
ELECT CAUTERY BLADE 6.4 (BLADE) ×2 IMPLANT
ELECT REM PT RETURN 9FT ADLT (ELECTROSURGICAL) ×3
ELECTRODE REM PT RTRN 9FT ADLT (ELECTROSURGICAL) ×1 IMPLANT
ENDOPOUCH RETRIEVER 10 (MISCELLANEOUS) ×1 IMPLANT
GAUZE SPONGE 4X4 12PLY STRL (GAUZE/BANDAGES/DRESSINGS) ×2 IMPLANT
GLOVE BIO SURGEON STRL SZ7 (GLOVE) ×13 IMPLANT
GOWN STRL REUS W/ TWL LRG LVL3 (GOWN DISPOSABLE) ×3 IMPLANT
GOWN STRL REUS W/TWL LRG LVL3 (GOWN DISPOSABLE) ×9
HOLDER FOLEY CATH W/STRAP (MISCELLANEOUS) ×2 IMPLANT
IRRIGATION STRYKERFLOW (MISCELLANEOUS) ×1 IMPLANT
IRRIGATOR STRYKERFLOW (MISCELLANEOUS) ×3
IV SOD CHL 0.9% 1000ML (IV SOLUTION) ×3 IMPLANT
JP BULB ×4 IMPLANT
L-HOOK LAP DISP 36CM (ELECTROSURGICAL) ×3
LHOOK LAP DISP 36CM (ELECTROSURGICAL) ×1 IMPLANT
LIQUID BAND (GAUZE/BANDAGES/DRESSINGS) ×1 IMPLANT
NDL FILTER BLUNT 18X1 1/2 (NEEDLE) IMPLANT
NEEDLE FILTER BLUNT 18X 1/2SAF (NEEDLE) ×2
NEEDLE FILTER BLUNT 18X1 1/2 (NEEDLE) ×1 IMPLANT
NEEDLE HYPO 22GX1.5 SAFETY (NEEDLE) ×3 IMPLANT
PACK LAP CHOLECYSTECTOMY (MISCELLANEOUS) ×3 IMPLANT
PAD CLEANER CAUTERY TIP 5X5 (MISCELLANEOUS) ×2
PENCIL ELECTRO HAND CTR (MISCELLANEOUS) ×3 IMPLANT
SCD SLEEVE MEDIUM ×2 IMPLANT
SCISSORS METZENBAUM CVD 33 (INSTRUMENTS) ×3 IMPLANT
SLEEVE ADV FIXATION 5X100MM (TROCAR) ×6 IMPLANT
SPONGE LAP 18X18 5 PK (GAUZE/BANDAGES/DRESSINGS) ×8 IMPLANT
STAPLER SKIN PROX 35W (STAPLE) ×2 IMPLANT
STOPCOCK 3 WAY  REPLAC (MISCELLANEOUS) IMPLANT
SUT ETHIBOND 0 MO6 C/R (SUTURE) IMPLANT
SUT MNCRL AB 4-0 PS2 18 (SUTURE) ×3 IMPLANT
SUT PDS AB 1 CT1 27 (SUTURE) ×6 IMPLANT
SUT SILK 0 CT 1 30 (SUTURE) ×6 IMPLANT
SUT SILK 2 0 SH (SUTURE) ×2 IMPLANT
SUT SILK 3-0 (SUTURE) ×2 IMPLANT
SUT VIC AB 0 CT1 36 (SUTURE) IMPLANT
SUT VICRYL 0 AB UR-6 (SUTURE) ×6 IMPLANT
SYR 20CC LL (SYRINGE) ×3 IMPLANT
TRAY FOLEY W/METER SILVER 16FR (SET/KITS/TRAYS/PACK) ×2 IMPLANT
TROCAR 130MM GELPORT  DAV (MISCELLANEOUS) ×2 IMPLANT
TROCAR XCEL BLUNT TIP 100MML (ENDOMECHANICALS) ×3 IMPLANT
TROCAR Z-THREAD OPTICAL 5X100M (TROCAR) ×3 IMPLANT
TUBING INSUFFLATOR HI FLOW (MISCELLANEOUS) ×3 IMPLANT
WATER STERILE IRR 1000ML POUR (IV SOLUTION) ×3 IMPLANT

## 2015-06-25 NOTE — Care Management (Signed)
I met with patient. He denies difficulty paying for medications. He lives in [place] with his mother. Household income  <$2500/month. Established with [facility] in [facility] (Dr. Natale Milch) per patient. He states he and his mom are able to drive. RNCM watch for [place] assistance on new meds.

## 2015-06-25 NOTE — Transfer of Care (Signed)
Immediate Anesthesia Transfer of Care Note  Patient: Eric Dalton  Procedure(s) Performed: Procedure(s): LAPAROSCOPY DIAGNOSTIC, laparotomy with placement of blake drains X 2 (N/A)  Patient Location: PACU  Anesthesia Type:General  Level of Consciousness: awake and alert   Airway & Oxygen Therapy: Patient Spontanous Breathing and Patient connected to face mask oxygen  Post-op Assessment: Report given to RN and Post -op Vital signs reviewed and stable  Post vital signs: stable  Last Vitals:  Filed Vitals:   06/25/15 1338 06/25/15 2225  BP: 141/79 172/85  Pulse: 65 121  Temp: 36.5 C 36.9 C  Resp: 20 15    Complications: No apparent anesthesia complications

## 2015-06-25 NOTE — Op Note (Addendum)
Pre-operative Diagnosis:Free Air  Post-operative Diagnosis: Same  Surgeon: Merri Rayiego F Maylynn Orzechowski   Anesthesia: General endotracheal anesthesia  ASA Class: 2  Surgeon: Sterling Bigiego Jaelin Devincentis , MD FACS  Anesthesia: Gen. with endotracheal tube  Procedures: Diagnostic laparoscopy conversion to Exploratory laparotomy                        Placement of blake drains foramen of winslow and anterior to the duodenum                        Sharp debridement of fascia periumbilical incision ( measures 2 cms2)   Findings: No evidence of bowel injury Mild duodenitis in the second portion of the duodenum Hematoma in the periumbilical incision No other acute into abdominal abnormalities Necrotic edges of the fascia at the umbilical site No evidence of Methylene blue extravasation intraoperatively  Estimated Blood Loss: 100cc         Drains: # 19 blake drains x 2         Specimens: Fascia       Complications: none         Condition: stable  Procedure Details  The patient was seen again in the Holding Room. The benefits, complications, treatment options, and expected outcomes were discussed with the patient. The risks of bleeding, infection, recurrence of symptoms, failure to resolve symptoms,  bowel injury, any of which could require further surgery were reviewed with the patient.   The patient was taken to Operating Room, identified as Eric Dalton and the procedure verified.  A Time Out was held and the above information confirmed.  Prior to the induction of general anesthesia, antibiotic prophylaxis was administered. VTE prophylaxis was in place. General endotracheal anesthesia was then administered and tolerated well. After the induction, the abdomen was prepped with Chloraprep and draped in the sterile fashion. The patient was positioned in the supine position. I started with reopening the periumbilical incision using a 15 blade knife. We drained some all blot. There was no evidence of purulence, or  necrotizing infection. We entered and all cavity under direct visualization after incising the fascia with a 15 blade knife and removing the previous fascial stitches. Pneumoperitoneum was obtained and 35 mm ports were placed in the epigastric area and the left flank under direct visualization. The small bowel was run laparoscopically from the ligament of Treitz all the way to the terminal ileum there was no evidence of perforation. The appendix was normal and the cecum was normal. The transverse colon was also normal as well as the sigmoid colon. Attention then was turned to the right upper quadrant and there was some evidence of duodenitis in the first and second portion of the duodenum. And there was no evidence of bile leak or enteric contents. At this point there was significant inflammatory reaction in the duodenum and I felt that I needed to explore him and do a Coker maneuver. At this point I converted to an exploratory laparotomy using a 10 blade knife in the standard fashion. A Bookwalter retractor was placed and we were able to obtain adequate exposure to perform a Coker maneuver of the duodenum. Were able to look at the posterior wall of the first second portion of duodenum and there was no evidence of perforation. There was some duodenitis and the serosa of the first and second portion of duodenum but no evidence of any definitive injuries. We also opened the lesser sac with  electrocautery and look at the posterior wall of the stomach as well as the pancreas there was no evidence of any necrotizing pancreatitis or perforated gastric ulcers. We placed an NG tube and methylene blue was injected by anesthesia. There was no evidence of contrast extravasation intraoperatively. His of the duodenitis and questionable ulcer or for perforation I decided to place 2 Blake drains in the standard fashion while in the foramina Winslow and 1 anterior to the duodenum. Wound was irrigated the standard fashion. And there  was evidence of some necrotic fascia but no frank evidence of necrotizing fasciitis. Because of the only known cause of the free air I went ahead and debrided the fascial edges with Mayo and in a sharp fashion and sent this for culture. The abdomen was irrigated and we closed the laparotomy with #1 PDS in a continuous fashion. Subcutaneous tissue was irrigated and the skin was closed with staples. Patient tolerated procedure well and there were no medical occasions. He laparotomy count were correct. We injected Marcaine quarter percent with epinephrine along the incision sites for postoperative analgesia.  Sterling Big, MD, FACS
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
## 2015-06-25 NOTE — Progress Notes (Signed)
CC: Abdominal pain Subjective: Patient reports no acute changes overnight. States his pain has been well-controlled with the as needed pain medications but also states that appears unchanged from admission. He denies any fevers, chills, nausea, vomiting, diarrhea, constipation. At the time of entry into the room patient was standing upright using a urinal without any difficulty.  Objective: Vital signs in last 24 hours: Temp:  [97.4 F (36.3 C)-97.8 F (36.6 C)] 97.7 F (36.5 C) (03/24 1338) Pulse Rate:  [65-76] 65 (03/24 1338) Resp:  [18-20] 20 (03/24 1338) BP: (120-141)/(66-82) 141/79 mmHg (03/24 1338) SpO2:  [98 %-100 %] 99 % (03/24 1338) Last BM Date: 06/21/15  Intake/Output from previous day: 03/23 0701 - 03/24 0700 In: 240 [P.O.:240] Out: 1770 [Urine:1770] Intake/Output this shift:    Physical exam:  Gen.: No acute distress Chest: Clear to auscultation Heart: Regular rate and rhythm Abdomen: Soft, minimally tender to deep palpation, difficult to ascertain whether or not there is any tympany secondary to abundant loose skin over his peritoneal cavity. No peritoneal signs on exam  Lab Results: CBC   Recent Labs  06/22/15 2011 06/25/15 0508  WBC 5.8 5.7  HGB 13.2 11.9*  HCT 38.2* 34.9*  PLT 267 239   BMET  Recent Labs  06/22/15 2011 06/25/15 0508  NA 133*  --   K 3.7  --   CL 100*  --   CO2 24  --   GLUCOSE 91  --   BUN 8  --   CREATININE 0.78 0.88  CALCIUM 8.9  --    PT/INR No results for input(s): LABPROT, INR in the last 72 hours. ABG No results for input(s): PHART, HCO3 in the last 72 hours.  Invalid input(s): PCO2, PO2  Studies/Results: Dg Abd Acute W/chest  06/25/2015  CLINICAL DATA:  Gallbladder surgery (06/10/2015), currently admitted with perforated viscus EXAM: DG ABDOMEN ACUTE W/ 1V CHEST COMPARISON:  CT abdomen and pelvis - 06/22/2015 ; 06/08/2015 FINDINGS: Normal cardiac silhouette and mediastinal contours. No focal airspace opacities.  No pleural effusion or pneumothorax. No evidence of edema. Moderate amount of pneumoperitoneum persists below the bilateral hemidiaphragms, similar to minimally decreased since the 06/2018 04/2015 abdominal CT. Enteric contrast is seen throughout the colon with enteric contrast opacification of the appendix. Nonobstructive bowel gas pattern. Post cholecystectomy. No acute osseus abnormalities. IMPRESSION: Moderate amount of pneumoperitoneum, similar to minimally decreased since abdominal CT performed 06/22/2015. Electronically Signed   By: Simonne ComeJohn  Watts M.D.   On: 06/25/2015 08:43    Anti-infectives: Anti-infectives    None      Assessment/Plan:  30 year old male with complex problem. Repeat acute abdominal series shows continued pneumoperitoneum. Had long conversation with the patient and his mother about possible sources for continued gas that is free of the abdomen. Given that he's 15 days out from her surgery it is very unlikely this is secondary from the surgery. Given his complete lack of symptoms other than persistent abdominal pain that has been well controlled with when necessary medications I am unsure whether or not this patient needs an operation. Patient does not give a clear history and his exam actually is somewhat complicated by the fact that he has had a large amount of weight loss in his life. Given these compounding factors discussed with patient that we would repeat contrasted studies today. I am concerned that he will still require an operative intervention as the source of his pneumoperitoneum has not been clearly identified. Continue nothing by mouth, continue IV fluids, continue  PPI drip, contrasted imaging study today.  Jayvan Mcshan T. Tonita Cong, MD, FACS  06/25/2015

## 2015-06-25 NOTE — Anesthesia Procedure Notes (Signed)
Procedure Name: Intubation Date/Time: 06/25/2015 6:57 PM Performed by: Irving BurtonBACHICH, Bland Rudzinski Pre-anesthesia Checklist: Patient identified, Emergency Drugs available, Suction available and Patient being monitored Patient Re-evaluated:Patient Re-evaluated prior to inductionOxygen Delivery Method: Circle system utilized Preoxygenation: Pre-oxygenation with 100% oxygen Intubation Type: IV induction Ventilation: Mask ventilation without difficulty Laryngoscope Size: Mac and 4 Grade View: Grade II Tube type: Oral Tube size: 7.5 mm Number of attempts: 1 Airway Equipment and Method: Patient positioned with wedge pillow and Stylet Placement Confirmation: positive ETCO2 and breath sounds checked- equal and bilateral Secured at: 22 cm Tube secured with: Tape Dental Injury: Teeth and Oropharynx as per pre-operative assessment  Comments: Very poor dentition

## 2015-06-25 NOTE — Progress Notes (Signed)
Patient reexamined after CT scan. Patient continues to have some abdominal discomfort that is tender to palpation. Patient states he thinks he is "doing okay "right now because he just received pain medication.  CT scan results reviewed. Continued pneumoperitoneum now with additional free fluid noticed within the pelvis. This is a change from prior CT scan. The amount of pneumoperitoneum is minimally if any different but the fluid is new.  Discussed with the patient and his mother the findings of the CT scan and that given that the findings have progressed to show additional signs of inflammation and possible he could have a perforated viscus that it is time to at least perform a diagnostic laparoscopy.  Discussed that the timing of the operation would depend upon the availability of the operating room.  Case will likely be performed by oncoming partner Dr. Everlene FarrierPabon Patient and his mother are okay with this.  Eric Frameharles Cassidy Tabet, MD FACS General Surgeon [facility]

## 2015-06-25 NOTE — Progress Notes (Signed)
Preoperative Informed Consent Review   I have discussed the risks, benefits, and options for this particular procedure with Eric Dalton and family.  He understands the risk for this operation and the alternatives for intervention.  He is in agreement.  He has had his questions answered to his stated satisfaction.  Patient agrees to proceed with this procedure at this time. We will start with diagnostic laparoscopy possible laparotomy, depending on findings we will taylor surgical therapy.  Diego Pabon MD FACS

## 2015-06-26 LAB — CBC
HCT: 37.1 % — ABNORMAL LOW (ref 40.0–52.0)
HEMOGLOBIN: 12.9 g/dL — AB (ref 13.0–18.0)
MCH: 28.3 pg (ref 26.0–34.0)
MCHC: 34.8 g/dL (ref 32.0–36.0)
MCV: 81.4 fL (ref 80.0–100.0)
Platelets: 307 10*3/uL (ref 150–440)
RBC: 4.56 MIL/uL (ref 4.40–5.90)
RDW: 13.2 % (ref 11.5–14.5)
WBC: 12.5 10*3/uL — ABNORMAL HIGH (ref 3.8–10.6)

## 2015-06-26 LAB — BASIC METABOLIC PANEL
ANION GAP: 7 (ref 5–15)
BUN: 7 mg/dL (ref 6–20)
CHLORIDE: 99 mmol/L — AB (ref 101–111)
CO2: 28 mmol/L (ref 22–32)
Calcium: 8.8 mg/dL — ABNORMAL LOW (ref 8.9–10.3)
Creatinine, Ser: 0.9 mg/dL (ref 0.61–1.24)
GFR calc Af Amer: >60 mL/min (ref >60)
GFR calc non Af Amer: >60 mL/min (ref >60)
Glucose, Bld: 180 mg/dL — ABNORMAL HIGH (ref 65–99)
POTASSIUM: 4 mmol/L (ref 3.5–5.1)
SODIUM: 134 mmol/L — AB (ref 135–145)

## 2015-06-26 MED ORDER — MORPHINE SULFATE 2 MG/ML IV SOLN
INTRAVENOUS | Status: DC
  Administered 2015-06-26: 24 mg via INTRAVENOUS
  Administered 2015-06-26: 21:00:00 via INTRAVENOUS
  Administered 2015-06-26: 10.5 mg via INTRAVENOUS
  Administered 2015-06-26: 13.5 mL via INTRAVENOUS
  Administered 2015-06-27: 6 mg via INTRAVENOUS
  Administered 2015-06-27: 12 mg via INTRAVENOUS
  Administered 2015-06-27: 21 mg via INTRAVENOUS
  Administered 2015-06-27: 15:00:00 via INTRAVENOUS
  Administered 2015-06-28: 3 mg via INTRAVENOUS
  Administered 2015-06-28: 12:00:00 via INTRAVENOUS
  Administered 2015-06-28: 13.5 mg via INTRAVENOUS
  Administered 2015-06-29: 1.5 mg via INTRAVENOUS
  Filled 2015-06-26 (×7): qty 25

## 2015-06-26 MED ORDER — ACETAMINOPHEN 10 MG/ML IV SOLN
1000.0000 mg | Freq: Four times a day (QID) | INTRAVENOUS | Status: DC
  Filled 2015-06-26 (×3): qty 100

## 2015-06-26 MED ORDER — DIPHENHYDRAMINE HCL 12.5 MG/5ML PO ELIX: 12.5000 mg | ORAL_SOLUTION | Freq: Four times a day (QID) | ORAL | Status: DC | PRN

## 2015-06-26 MED ORDER — LORAZEPAM 2 MG/ML IJ SOLN
INTRAMUSCULAR | Status: AC
  Administered 2015-06-26: 02:00:00 1 mg via INTRAVENOUS
  Filled 2015-06-26: qty 1

## 2015-06-26 MED ORDER — LORAZEPAM 2 MG/ML IJ SOLN
1.0000 mg | Freq: Once | INTRAMUSCULAR | Status: AC
  Administered 2015-06-26: 1 mg via INTRAVENOUS

## 2015-06-26 MED ORDER — ONDANSETRON HCL 4 MG/2ML IJ SOLN: 4.0000 mg | Freq: Four times a day (QID) | INTRAMUSCULAR | Status: DC | PRN

## 2015-06-26 MED ORDER — SODIUM CHLORIDE 0.9% FLUSH: 9.0000 mL | INTRAVENOUS | Status: DC | PRN

## 2015-06-26 MED ORDER — NALOXONE HCL 0.4 MG/ML IJ SOLN: 0.4000 mg | INTRAMUSCULAR | Status: DC | PRN

## 2015-06-26 MED ORDER — ACETAMINOPHEN 10 MG/ML IV SOLN
1000.0000 mg | Freq: Four times a day (QID) | INTRAVENOUS | Status: AC
  Administered 2015-06-26 (×4): 1000 mg via INTRAVENOUS
  Filled 2015-06-26 (×6): qty 100

## 2015-06-26 MED ORDER — DIPHENHYDRAMINE HCL 50 MG/ML IJ SOLN: 12.5000 mg | Freq: Four times a day (QID) | INTRAMUSCULAR | Status: DC | PRN

## 2015-06-26 MED ORDER — MORPHINE SULFATE 2 MG/ML IV SOLN: INTRAVENOUS | Status: DC

## 2015-06-26 MED ORDER — MORPHINE SULFATE (PF) 2 MG/ML IV SOLN
INTRAVENOUS | Status: AC
  Administered 2015-06-26: 01:00:00 2 mg via INTRAVENOUS
  Filled 2015-06-26: qty 1

## 2015-06-26 MED ORDER — CETYLPYRIDINIUM CHLORIDE 0.05 % MT LIQD
7.0000 mL | Freq: Two times a day (BID) | OROMUCOSAL | Status: DC
  Administered 2015-06-26 – 2015-06-30 (×2): 7 mL via OROMUCOSAL

## 2015-06-26 NOTE — Progress Notes (Signed)
Called by nursing for patient needing urgent administration of anxiolytic.  Reports patient pulling at NG tube and pulling at JP drains.  1mg  IV ativan ordered x1.  Nurse to continue trying to contact primary team for further orders.  Kristeen MissWILLIS, Jonquil Stubbe FIELDING [facility] 06/26/2015, 1:40 AM

## 2015-06-26 NOTE — Anesthesia Postprocedure Evaluation (Signed)
Anesthesia Post Note  Patient: Coralie CarpenCasey L Regan  Procedure(s) Performed: Procedure(s) (LRB): LAPAROSCOPY DIAGNOSTIC, laparotomy with placement of blake drains X 2 (N/A)  Patient location during evaluation: PACU Anesthesia Type: General Level of consciousness: awake and alert Pain management: pain level controlled Vital Signs Assessment: post-procedure vital signs reviewed and stable Respiratory status: spontaneous breathing, nonlabored ventilation, respiratory function stable and patient connected to nasal cannula oxygen Cardiovascular status: blood pressure returned to baseline and stable Postop Assessment: no signs of nausea or vomiting Anesthetic complications: no    Last Vitals:  Filed Vitals:   06/26/15 0249 06/26/15 0513  BP:  137/73  Pulse:  107  Temp:  36.7 C  Resp: 16 16    Last Pain:  Filed Vitals:   06/26/15 0601  PainSc: Asleep                 Lenard SimmerAndrew Breana Litts

## 2015-06-26 NOTE — Progress Notes (Signed)
1 Day Post-Op   Subjective:  Patient is postop day 1 from exploratory laparotomy due to persistent free air. Patient reports she does have some pain at his incision site but is so far controlled with the as needed pain medications. His primary complaint is of his NG tube. He denies any nausea. He has not been out of bed yet.  Vital signs in last 24 hours: Temp:  [97.7 F (36.5 C)-98.7 F (37.1 C)] 98.1 F (36.7 C) (03/25 0513) Pulse Rate:  [65-121] 107 (03/25 0513) Resp:  [13-20] 16 (03/25 0513) BP: (137-172)/(73-94) 137/73 mmHg (03/25 0513) SpO2:  [97 %-100 %] 99 % (03/25 0513) Last BM Date: 06/21/15  Intake/Output from previous day: 03/24 0701 - 03/25 0700 In: 1850 [I.V.:1850] Out: 4690 [Urine:4550; Drains:40; Blood:100]  Physical exam: Gen.: No acute distress Chest: Clear to auscultation Heart: Tachycardic GI: Abdomen is soft, appropriately tender to palpation along the midline, and nondistended. Drains in place draining a serosanguineous fluid, midline staples in place without evidence of erythema or drainage.  Lab Results:  CBC  Recent Labs  06/25/15 0508 06/26/15 0522  WBC 5.7 12.5*  HGB 11.9* 12.9*  HCT 34.9* 37.1*  PLT 239 307   CMP     Component Value Date/Time   NA 134* 06/26/2015 0522   K 4.0 06/26/2015 0522   CL 99* 06/26/2015 0522   CO2 28 06/26/2015 0522   GLUCOSE 180* 06/26/2015 0522   BUN 7 06/26/2015 0522   CREATININE 0.90 06/26/2015 0522   CALCIUM 8.8* 06/26/2015 0522   PROT 7.9 06/22/2015 2011   ALBUMIN 4.0 06/22/2015 2011   AST 25 06/22/2015 2011   ALT 25 06/22/2015 2011   ALKPHOS 64 06/22/2015 2011   BILITOT 0.6 06/22/2015 2011   GFRNONAA >60 06/26/2015 0522   GFRAA >60 06/26/2015 0522   PT/INR No results for input(s): LABPROT, INR in the last 72 hours.  Studies/Results: Ct Abdomen Pelvis W Contrast  06/25/2015  CLINICAL DATA:  Follow-up pneumoperitoneum. Generalized abdominal pain. Cholecystectomy on 06/10/2015. EXAM: CT ABDOMEN  AND PELVIS WITH CONTRAST TECHNIQUE: Multidetector CT imaging of the abdomen and pelvis was performed using the standard protocol following bolus administration of intravenous contrast. CONTRAST:  100mL ISOVUE-300 IOPAMIDOL (ISOVUE-300) INJECTION 61% COMPARISON:  CT 06/22/2015 FINDINGS: Lower chest: Lung bases are clear. There is no evidence for a basilar pneumothorax. Hepatobiliary: Normal appearance of the liver with small amount of gas along the falciform ligament. There is a small amount of fluid in the gallbladder fossa from recent cholecystectomy but the amount of fluid has not significantly changed. There is minimal intrahepatic biliary dilatation. Extrahepatic bile duct near the pancreatic head roughly measures 9 mm and previously measured 6 mm. Pancreas: Normal appearance of the pancreas. Spleen: Normal appearance of spleen without enlargement. Adrenals/Urinary Tract: Normal adrenal glands. No acute abnormality to the kidneys or urinary bladder. Stomach/Bowel: Normal appearance of stomach and duodenum. Small amount of extraluminal gas near the duodenum bulb. Normal caliber of the small bowel without dilatation. Focal wall thickening involving the sigmoid colon on sequence 2, image 63 is new, nonspecific and could be related to peristalsis. Oral contrast throughout the colon including the appendix. Vascular/Lymphatic: Normal appearance of the aorta. No significant abdominal or pelvic lymphadenopathy. Reproductive: No gross abnormality to the prostate or seminal vesicles. Other: There is a small amount of free fluid in the pelvis which is new. There continues to be a large amount of intraperitoneal air. The amount of free air in the upper abdomen has  minimally changed from 06/21/2005. There continues to pockets of free air throughout the anterior mid abdomen. There continues to be a small amount of fluid and gas near the umbilicus related to surgical incision site. Small amount of subcutaneous gas in left  anterior abdomen may be related to injection sites. Musculoskeletal: No suspicious osseous lesions. IMPRESSION: Persistent pneumoperitoneum. The degree of free air has minimally changed since 06/22/2015. The source for free air is unclear but there continues to be some gas and fluid near the umbilicus. However, there is new free fluid in the pelvis which would raise concern for an underlying inflammatory process. Short segment of bowel wall thickening in the sigmoid colon is new from the previous examination. This could be related to peristalsis. Slight enlargement of the extrahepatic bile duct probably related to recent cholecystectomy. Stable post surgical changes at the cholecystectomy bed. Electronically Signed   By: Richarda Overlie M.D.   On: 06/25/2015 16:00   Dg Abd Acute W/chest  06/25/2015  CLINICAL DATA:  Gallbladder surgery (06/10/2015), currently admitted with perforated viscus EXAM: DG ABDOMEN ACUTE W/ 1V CHEST COMPARISON:  CT abdomen and pelvis - 06/22/2015 ; 06/08/2015 FINDINGS: Normal cardiac silhouette and mediastinal contours. No focal airspace opacities. No pleural effusion or pneumothorax. No evidence of edema. Moderate amount of pneumoperitoneum persists below the bilateral hemidiaphragms, similar to minimally decreased since the 06/2018 04/2015 abdominal CT. Enteric contrast is seen throughout the colon with enteric contrast opacification of the appendix. Nonobstructive bowel gas pattern. Post cholecystectomy. No acute osseus abnormalities. IMPRESSION: Moderate amount of pneumoperitoneum, similar to minimally decreased since abdominal CT performed 06/22/2015. Electronically Signed   By: Simonne Come M.D.   On: 06/25/2015 08:43    Assessment/Plan: 30 year old male 1 day status post exploratory laparotomy. Plan to remove NG tube today due to minimal output and minimal symptoms. Discussed with nursing staff that patient can ambulate within be able to remove his Foley catheter. Continue Blake  drains until patient tolerates a regular diet. Encourage ambulation and incentive spirometry usage.   Ricarda Frame, MD FACS General Surgeon  06/26/2015

## 2015-06-26 NOTE — Progress Notes (Signed)
Pt admitted to 205; [place] given from [place], [place].  Oriented pt to nsg staff, room, unit.  All questions answered; will continue to monitor.

## 2015-06-26 NOTE — Progress Notes (Signed)
Pt to be transferred to room 205 for surgical care.  [facility]  Pt transferred per orders.

## 2015-06-27 LAB — CBC
HEMATOCRIT: 34.8 % — AB (ref 40.0–52.0)
HEMOGLOBIN: 11.9 g/dL — AB (ref 13.0–18.0)
MCH: 28.3 pg (ref 26.0–34.0)
MCHC: 34.3 g/dL (ref 32.0–36.0)
MCV: 82.4 fL (ref 80.0–100.0)
Platelets: 275 10*3/uL (ref 150–440)
RBC: 4.22 MIL/uL — ABNORMAL LOW (ref 4.40–5.90)
RDW: 13.4 % (ref 11.5–14.5)
WBC: 6.6 10*3/uL (ref 3.8–10.6)

## 2015-06-27 LAB — BASIC METABOLIC PANEL
Anion gap: 6 (ref 5–15)
BUN: 8 mg/dL (ref 6–20)
CHLORIDE: 101 mmol/L (ref 101–111)
CO2: 30 mmol/L (ref 22–32)
CREATININE: 0.74 mg/dL (ref 0.61–1.24)
Calcium: 8.5 mg/dL — ABNORMAL LOW (ref 8.9–10.3)
GFR calc non Af Amer: >60 mL/min (ref >60)
Glucose, Bld: 95 mg/dL (ref 65–99)
POTASSIUM: 3.7 mmol/L (ref 3.5–5.1)
Sodium: 137 mmol/L (ref 135–145)

## 2015-06-27 LAB — PHOSPHORUS: PHOSPHORUS: 3.2 mg/dL (ref 2.5–4.6)

## 2015-06-27 LAB — MAGNESIUM: Magnesium: 1.8 mg/dL (ref 1.7–2.4)

## 2015-06-27 MED ORDER — SERTRALINE HCL 50 MG PO TABS
75.0000 mg | ORAL_TABLET | Freq: Every day | ORAL | Status: DC
  Administered 2015-06-27 – 2015-07-01 (×5): 75 mg via ORAL
  Filled 2015-06-27 (×5): qty 2

## 2015-06-27 NOTE — Progress Notes (Signed)
Pt requested Zoloft 75 mg daily, MD notified orders taken

## 2015-06-27 NOTE — Progress Notes (Signed)
2 Days Post-Op   Subjective:  Patient reports no acute events overnight. States that the PCA continues to help with this pain. He states he has not passed any flatus since surgery but also denies any nausea. He has not been out of bed since surgery.  Vital signs in last 24 hours: Temp:  [98 F (36.7 C)-98.7 F (37.1 C)] 98 F (36.7 C) (03/26 0625) Pulse Rate:  [66-92] 92 (03/26 0625) Resp:  [10-18] 14 (03/26 0842) BP: (146-152)/(85-94) 152/85 mmHg (03/26 0625) SpO2:  [95 %-99 %] 95 % (03/26 0842) Last BM Date: 06/21/15  Intake/Output from previous day: 03/25 0701 - 03/26 0700 In: 5294.5 [P.O.:120; I.V.:5174.5] Out: 993 [Urine:925; Drains:68]  Physical exam: Gen.: No acute distress Chest: Clear to auscultation Heart: Regular rate and rhythm GI: Abdomen is soft, purple tender to palpation at the incision sites, nondistended. Midline staples intact without evidence of drainage through the honeycomb dressing. Laparoscopic incisions dressed with plain gauze and tape without evidence of erythema or drainage on the dressing. JP drains in place draining a serosanguineous fluid.  Lab Results:  CBC  Recent Labs  06/26/15 0522 06/27/15 0715  WBC 12.5* 6.6  HGB 12.9* 11.9*  HCT 37.1* 34.8*  PLT 307 275   CMP     Component Value Date/Time   NA 137 06/27/2015 0715   K 3.7 06/27/2015 0715   CL 101 06/27/2015 0715   CO2 30 06/27/2015 0715   GLUCOSE 95 06/27/2015 0715   BUN 8 06/27/2015 0715   CREATININE 0.74 06/27/2015 0715   CALCIUM 8.5* 06/27/2015 0715   PROT 7.9 06/22/2015 2011   ALBUMIN 4.0 06/22/2015 2011   AST 25 06/22/2015 2011   ALT 25 06/22/2015 2011   ALKPHOS 64 06/22/2015 2011   BILITOT 0.6 06/22/2015 2011   GFRNONAA >60 06/27/2015 0715   GFRAA >60 06/27/2015 0715   PT/INR No results for input(s): LABPROT, INR in the last 72 hours.  Studies/Results: Ct Abdomen Pelvis W Contrast  06/25/2015  CLINICAL DATA:  Follow-up pneumoperitoneum. Generalized abdominal  pain. Cholecystectomy on 06/10/2015. EXAM: CT ABDOMEN AND PELVIS WITH CONTRAST TECHNIQUE: Multidetector CT imaging of the abdomen and pelvis was performed using the standard protocol following bolus administration of intravenous contrast. CONTRAST:  100mL ISOVUE-300 IOPAMIDOL (ISOVUE-300) INJECTION 61% COMPARISON:  CT 06/22/2015 FINDINGS: Lower chest: Lung bases are clear. There is no evidence for a basilar pneumothorax. Hepatobiliary: Normal appearance of the liver with small amount of gas along the falciform ligament. There is a small amount of fluid in the gallbladder fossa from recent cholecystectomy but the amount of fluid has not significantly changed. There is minimal intrahepatic biliary dilatation. Extrahepatic bile duct near the pancreatic head roughly measures 9 mm and previously measured 6 mm. Pancreas: Normal appearance of the pancreas. Spleen: Normal appearance of spleen without enlargement. Adrenals/Urinary Tract: Normal adrenal glands. No acute abnormality to the kidneys or urinary bladder. Stomach/Bowel: Normal appearance of stomach and duodenum. Small amount of extraluminal gas near the duodenum bulb. Normal caliber of the small bowel without dilatation. Focal wall thickening involving the sigmoid colon on sequence 2, image 63 is new, nonspecific and could be related to peristalsis. Oral contrast throughout the colon including the appendix. Vascular/Lymphatic: Normal appearance of the aorta. No significant abdominal or pelvic lymphadenopathy. Reproductive: No gross abnormality to the prostate or seminal vesicles. Other: There is a small amount of free fluid in the pelvis which is new. There continues to be a large amount of intraperitoneal air. The amount of  free air in the upper abdomen has minimally changed from 06/21/2005. There continues to pockets of free air throughout the anterior mid abdomen. There continues to be a small amount of fluid and gas near the umbilicus related to surgical  incision site. Small amount of subcutaneous gas in left anterior abdomen may be related to injection sites. Musculoskeletal: No suspicious osseous lesions. IMPRESSION: Persistent pneumoperitoneum. The degree of free air has minimally changed since 06/22/2015. The source for free air is unclear but there continues to be some gas and fluid near the umbilicus. However, there is new free fluid in the pelvis which would raise concern for an underlying inflammatory process. Short segment of bowel wall thickening in the sigmoid colon is new from the previous examination. This could be related to peristalsis. Slight enlargement of the extrahepatic bile duct probably related to recent cholecystectomy. Stable post surgical changes at the cholecystectomy bed. Electronically Signed   By: Richarda Overlie M.D.   On: 06/25/2015 16:00    Assessment/Plan: 30 year old male 2 days status post export her laparotomy for persistent free air. Needs to get out of bed today. Discussed the patient the need to at least sit upright in a chair he voiced understanding and agreement to try. Foley to be removed today. Continue to await return of bowel function. Encourage ambulation, incentive spirometer usage. Keep PCA in place until patient tolerates diet and can be transitioned to oral medications.   Ricarda Frame, MD FACS General Surgeon  06/27/2015

## 2015-06-27 NOTE — Progress Notes (Addendum)
Morphine PCA vial changed; 0ml waste (witnessed by A. Roxan Hockeyobinson)    Witnessed PCA changed ; 0 mL waste with [employer] FinancialMaggie Oaks.  [name],RN

## 2015-06-28 ENCOUNTER — Encounter: Payer: Self-pay | Admitting: Surgery

## 2015-06-28 LAB — CBC
HEMATOCRIT: 34 % — AB (ref 40.0–52.0)
HEMOGLOBIN: 11.8 g/dL — AB (ref 13.0–18.0)
MCH: 29 pg (ref 26.0–34.0)
MCHC: 34.8 g/dL (ref 32.0–36.0)
MCV: 83.1 fL (ref 80.0–100.0)
Platelets: 276 10*3/uL (ref 150–440)
RBC: 4.09 MIL/uL — ABNORMAL LOW (ref 4.40–5.90)
RDW: 13.1 % (ref 11.5–14.5)
WBC: 6.6 10*3/uL (ref 3.8–10.6)

## 2015-06-28 LAB — BASIC METABOLIC PANEL
ANION GAP: 6 (ref 5–15)
BUN: 7 mg/dL (ref 6–20)
CO2: 33 mmol/L — AB (ref 22–32)
CREATININE: 0.71 mg/dL (ref 0.61–1.24)
Calcium: 8.8 mg/dL — ABNORMAL LOW (ref 8.9–10.3)
Chloride: 96 mmol/L — ABNORMAL LOW (ref 101–111)
GFR calc non Af Amer: >60 mL/min (ref >60)
Glucose, Bld: 100 mg/dL — ABNORMAL HIGH (ref 65–99)
Potassium: 3.7 mmol/L (ref 3.5–5.1)
SODIUM: 135 mmol/L (ref 135–145)

## 2015-06-28 LAB — PHOSPHORUS: PHOSPHORUS: 3.8 mg/dL (ref 2.5–4.6)

## 2015-06-28 LAB — MAGNESIUM: MAGNESIUM: 1.7 mg/dL (ref 1.7–2.4)

## 2015-06-28 MED ORDER — CYCLOBENZAPRINE HCL 10 MG PO TABS
10.0000 mg | ORAL_TABLET | Freq: Three times a day (TID) | ORAL | Status: DC
  Administered 2015-06-28 – 2015-07-02 (×10): 10 mg via ORAL
  Filled 2015-06-28 (×11): qty 1

## 2015-06-28 NOTE — Progress Notes (Signed)
30 yr old male POD#3 Ex-lap showing duodenitis.  Patient still having increased pain requiring PCA.  The PCA machine keeps going off for lower saturations however they come up very quickly, may be positional from the probe.  HE is up and walking to restroom, encouraged to wall in room.  He denies passing flatus.   Filed Vitals:   06/28/15 1213 06/28/15 1254  BP:  155/94  Pulse:  81  Temp:  97.8 F (36.6 C)  Resp: 11 17   I/O last 3 completed shifts: In: 4517.5 [P.O.:220; I.V.:4277.5; Other:20] Out: 1935 [Urine:1875; Drains:60] Total I/O In: 0  Out: 550 [Urine:550]   PE:  Gen: NAD Abd: soft, appropriately tender, incision c/d/i, jp drains with serosanginous drainage Ext: 2+ pulse, no edema  CBC Latest Ref Rng 06/28/2015 06/27/2015 06/26/2015  WBC 3.8 - 10.6 K/uL 6.6 6.6 12.5(H)  Hemoglobin 13.0 - 18.0 g/dL 11.8(L) 11.9(L) 12.9(L)  Hematocrit 40.0 - 52.0 % 34.0(L) 34.8(L) 37.1(L)  Platelets 150 - 440 K/uL 276 275 307   CMP Latest Ref Rng 06/28/2015 06/27/2015 06/26/2015  Glucose 65 - 99 mg/dL 161(W100(H) 95 960(A180(H)  BUN 6 - 20 mg/dL 7 8 7   Creatinine 0.61 - 1.24 mg/dL 5.400.71 9.810.74 1.910.90  Sodium 135 - 145 mmol/L 135 137 134(L)  Potassium 3.5 - 5.1 mmol/L 3.7 3.7 4.0  Chloride 101 - 111 mmol/L 96(L) 101 99(L)  CO2 22 - 32 mmol/L 33(H) 30 28  Calcium 8.9 - 10.3 mg/dL 4.7(W8.8(L) 2.9(F8.5(L) 6.2(Z8.8(L)  Total Protein 6.5 - 8.1 g/dL - - -  Total Bilirubin 0.3 - 1.2 mg/dL - - -  Alkaline Phos 38 - 126 U/L - - -  AST 15 - 41 U/L - - -  ALT 17 - 63 U/L - - -    A/P: 30 yr old male POD#3 Ex-lap showing duodenitis.   Pain: added flexeril, continue PCA Abd: continue PPI BID, continue JP drains until taking more PO, continue clear liquid diet Encourage ambulation
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
## 2015-06-29 LAB — ANAEROBIC CULTURE

## 2015-06-29 LAB — WOUND CULTURE: Culture: NO GROWTH

## 2015-06-29 MED ORDER — OXYCODONE HCL 5 MG PO TABS
10.0000 mg | ORAL_TABLET | ORAL | Status: DC | PRN
  Administered 2015-06-29 – 2015-07-02 (×12): 15 mg via ORAL
  Administered 2015-07-02: 10 mg via ORAL
  Administered 2015-07-02: 15 mg via ORAL
  Filled 2015-06-29 (×5): qty 3
  Filled 2015-06-29: qty 2
  Filled 2015-06-29 (×9): qty 3

## 2015-06-29 MED ORDER — MORPHINE SULFATE (PF) 2 MG/ML IV SOLN
2.0000 mg | INTRAVENOUS | Status: DC | PRN
  Administered 2015-06-29 – 2015-07-02 (×6): 2 mg via INTRAVENOUS
  Filled 2015-06-29 (×8): qty 1

## 2015-06-29 NOTE — Progress Notes (Signed)
PCA pump discontinued with Zollie Scalelivia, RN. Vial was empty no medication to waste.  Pt resting in recliner, continue to assess.

## 2015-06-29 NOTE — Progress Notes (Addendum)
30 yr old male POD#4 Ex-lap showing duodenitis.  Patient still having increased pain requiring PCA.  The PCA machine keeps going off for lower saturations however they come up very quickly, may be positional from the probe.  He is very frustrated with the machine.  He states he is feeling somewhat better. He had a milkshake earlier today   Filed Vitals:   06/29/15 1255 06/29/15 1322  BP: 154/86   Pulse: 92   Temp: 97.5 F (36.4 C)   Resp: 17 11   I/O last 3 completed shifts: In: 5033.9 [P.O.:100; I.V.:4913.9; Other:20] Out: 2602 [Urine:2600; Drains:2] Total I/O In: 875 [I.V.:875] Out: 1925 [Urine:1925]   PE:  Gen: NAD Abd: soft, appropriately tender, incision c/d/i, jp drains with serosanginous drainage Ext: 2+ pulse, no edema  CBC Latest Ref Rng 06/28/2015 06/27/2015 06/26/2015  WBC 3.8 - 10.6 K/uL 6.6 6.6 12.5(H)  Hemoglobin 13.0 - 18.0 g/dL 11.8(L) 11.9(L) 12.9(L)  Hematocrit 40.0 - 52.0 % 34.0(L) 34.8(L) 37.1(L)  Platelets 150 - 440 K/uL 276 275 307   CMP Latest Ref Rng 06/28/2015 06/27/2015 06/26/2015  Glucose 65 - 99 mg/dL 528(U100(H) 95 132(G180(H)  BUN 6 - 20 mg/dL 7 8 7   Creatinine 0.61 - 1.24 mg/dL 4.010.71 0.270.74 2.530.90  Sodium 135 - 145 mmol/L 135 137 134(L)  Potassium 3.5 - 5.1 mmol/L 3.7 3.7 4.0  Chloride 101 - 111 mmol/L 96(L) 101 99(L)  CO2 22 - 32 mmol/L 33(H) 30 28  Calcium 8.9 - 10.3 mg/dL 6.6(Y8.8(L) 4.0(H8.5(L) 4.7(Q8.8(L)  Total Protein 6.5 - 8.1 g/dL - - -  Total Bilirubin 0.3 - 1.2 mg/dL - - -  Alkaline Phos 38 - 126 U/L - - -  AST 15 - 41 U/L - - -  ALT 17 - 63 U/L - - -    A/P: 30 yr old male POD#4 Ex-lap showing duodenitis.   Pain: added flexeril,discontinue PCA, start po pain meds with iv prns Abd: continue PPI BID, continue JP drains until taking more PO, advance to full liquids  Encourage ambulation

## 2015-06-29 NOTE — Progress Notes (Signed)
New morphine syringe administered. 2 mL from previous syringe wasted in sink. Witnessed by Yehuda SavannahLauren Hobbs, RN.

## 2015-06-30 ENCOUNTER — Ambulatory Visit: Payer: Self-pay | Admitting: Surgery

## 2015-06-30 DIAGNOSIS — E440 Moderate protein-calorie malnutrition: Secondary | ICD-10-CM | POA: Insufficient documentation

## 2015-06-30 LAB — CBC
HEMATOCRIT: 33.6 % — AB (ref 40.0–52.0)
HEMOGLOBIN: 11.5 g/dL — AB (ref 13.0–18.0)
MCH: 28.5 pg (ref 26.0–34.0)
MCHC: 34.3 g/dL (ref 32.0–36.0)
MCV: 82.9 fL (ref 80.0–100.0)
Platelets: 294 10*3/uL (ref 150–440)
RBC: 4.05 MIL/uL — AB (ref 4.40–5.90)
RDW: 12.9 % (ref 11.5–14.5)
WBC: 6.2 10*3/uL (ref 3.8–10.6)

## 2015-06-30 LAB — BASIC METABOLIC PANEL
ANION GAP: 4 — AB (ref 5–15)
BUN: 6 mg/dL (ref 6–20)
CHLORIDE: 96 mmol/L — AB (ref 101–111)
CO2: 34 mmol/L — AB (ref 22–32)
Calcium: 8.8 mg/dL — ABNORMAL LOW (ref 8.9–10.3)
Creatinine, Ser: 0.77 mg/dL (ref 0.61–1.24)
GFR calc Af Amer: >60 mL/min (ref >60)
GLUCOSE: 92 mg/dL (ref 65–99)
POTASSIUM: 3.7 mmol/L (ref 3.5–5.1)
Sodium: 134 mmol/L — ABNORMAL LOW (ref 135–145)

## 2015-06-30 MED ORDER — HYDROCORTISONE 1 % EX LOTN
TOPICAL_LOTION | Freq: Three times a day (TID) | CUTANEOUS | Status: DC
  Administered 2015-06-30 – 2015-07-01 (×4): via TOPICAL
  Filled 2015-06-30: qty 120

## 2015-06-30 NOTE — Progress Notes (Signed)
5 Days Post-Op  Subjective: Is better today tolerating a full liquid diet still having some abdominal pain.  Objective: Vital signs in last 24 hours: Temp:  [97.5 F (36.4 C)-98 F (36.7 C)] 97.6 F (36.4 C) (03/29 0531) Pulse Rate:  [72-92] 78 (03/29 0531) Resp:  [11-17] 12 (03/29 0531) BP: (133-154)/(78-86) 144/78 mmHg (03/29 0531) SpO2:  [94 %-100 %] 99 % (03/29 0531) Last BM Date:  (pt unsure)  Intake/Output from previous day: 03/28 0701 - 03/29 0700 In: 4300.4 [P.O.:360; I.V.:3920.4] Out: 2431 [Urine:2425; Drains:6] Intake/Output this shift: Total I/O In: 200 [I.V.:200] Out: -   Physical exam:  Abdomen is soft and minimally tender no peritoneal signs nondistended wounds are clean.  Lab Results: CBC   Recent Labs  06/28/15 0443 06/30/15 0502  WBC 6.6 6.2  HGB 11.8* 11.5*  HCT 34.0* 33.6*  PLT 276 294   BMET  Recent Labs  06/28/15 0443 06/30/15 0502  NA 135 134*  K 3.7 3.7  CL 96* 96*  CO2 33* 34*  GLUCOSE 100* 92  BUN 7 6  CREATININE 0.71 0.77  CALCIUM 8.8* 8.8*   PT/INR No results for input(s): LABPROT, INR in the last 72 hours. ABG No results for input(s): PHART, HCO3 in the last 72 hours.  Invalid input(s): PCO2, PO2  Studies/Results: No results found.  Anti-infectives: Anti-infectives    Start     Dose/Rate Route Frequency Ordered Stop   06/25/15 1915  metroNIDAZOLE (FLAGYL) IVPB 500 mg     500 mg 100 mL/hr over 60 Minutes Intravenous  Once 06/25/15 1913 06/26/15 0157      Assessment/Plan: s/p Procedure(s): LAPAROSCOPY DIAGNOSTIC, laparotomy with placement of blake drains X 2   Will advance diet today to soft diet probably discharge tomorrow or the next day.Lattie Haw*  Seabron Iannello E Million Maharaj, MD, FACS  06/30/2015

## 2015-07-01 LAB — CBC WITH DIFFERENTIAL/PLATELET
Basophils Absolute: 0 10*3/uL (ref 0–0.1)
Basophils Relative: 1 %
EOS PCT: 6 %
Eosinophils Absolute: 0.3 10*3/uL (ref 0–0.7)
HEMATOCRIT: 31.1 % — AB (ref 40.0–52.0)
Hemoglobin: 10.4 g/dL — ABNORMAL LOW (ref 13.0–18.0)
LYMPHS ABS: 1.2 10*3/uL (ref 1.0–3.6)
LYMPHS PCT: 21 %
MCH: 27.9 pg (ref 26.0–34.0)
MCHC: 33.4 g/dL (ref 32.0–36.0)
MCV: 83.6 fL (ref 80.0–100.0)
MONO ABS: 0.6 10*3/uL (ref 0.2–1.0)
Monocytes Relative: 11 %
Neutro Abs: 3.6 10*3/uL (ref 1.4–6.5)
Neutrophils Relative %: 61 %
PLATELETS: 259 10*3/uL (ref 150–440)
RBC: 3.72 MIL/uL — ABNORMAL LOW (ref 4.40–5.90)
RDW: 12.9 % (ref 11.5–14.5)
WBC: 5.8 10*3/uL (ref 3.8–10.6)

## 2015-07-01 MED ORDER — OXYCODONE-ACETAMINOPHEN 5-325 MG PO TABS: 1.0000 | ORAL_TABLET | ORAL | Status: AC | PRN

## 2015-07-01 MED ORDER — OMEPRAZOLE 40 MG PO CPDR: 40.0000 mg | DELAYED_RELEASE_CAPSULE | Freq: Every day | ORAL | Status: AC

## 2015-07-01 MED ORDER — BISACODYL 10 MG RE SUPP: 10.0000 mg | RECTAL | Status: AC | PRN

## 2015-07-01 MED ORDER — CIPROFLOXACIN HCL 500 MG PO TABS: 500.0000 mg | ORAL_TABLET | Freq: Two times a day (BID) | ORAL | Status: AC

## 2015-07-01 NOTE — Progress Notes (Signed)
6 Days Post-Op  Subjective: Post laparoscopy/laparotomy for diagnosis of perforated peptic ulcer disease. He is doing well at this point feels better tolerating a diet and wants to go home.  Objective: Vital signs in last 24 hours: Temp:  [97.6 F (36.4 C)-97.7 F (36.5 C)] 97.6 F (36.4 C) (03/29 2035) Pulse Rate:  [76-103] 77 (03/30 0612) Resp:  [12-20] 20 (03/30 0612) BP: (125-142)/(74-76) 125/74 mmHg (03/30 0612) SpO2:  [97 %-99 %] 97 % (03/30 0612) Last BM Date: 06/21/15  Intake/Output from previous day: 03/29 0701 - 03/30 0700 In: 1223.5 [I.V.:1203.5] Out: 1900 [Urine:1900] Intake/Output this shift:    Physical exam:  Abdomen is soft and much less tender nondistended wounds clean  Lab Results: CBC   Recent Labs  06/30/15 0502 07/01/15 0527  WBC 6.2 5.8  HGB 11.5* 10.4*  HCT 33.6* 31.1*  PLT 294 259   BMET  Recent Labs  06/30/15 0502  NA 134*  K 3.7  CL 96*  CO2 34*  GLUCOSE 92  BUN 6  CREATININE 0.77  CALCIUM 8.8*   PT/INR No results for input(s): LABPROT, INR in the last 72 hours. ABG No results for input(s): PHART, HCO3 in the last 72 hours.  Invalid input(s): PCO2, PO2  Studies/Results: No results found.  Anti-infectives: Anti-infectives    Start     Dose/Rate Route Frequency Ordered Stop   06/25/15 1915  metroNIDAZOLE (FLAGYL) IVPB 500 mg     500 mg 100 mL/hr over 60 Minutes Intravenous  Once 06/25/15 1913 06/26/15 0157      Assessment/Plan: s/p Procedure(s): LAPAROSCOPY DIAGNOSTIC, laparotomy with placement of blake drains X 2   Advancing diet and will discharge later today to follow-up with Dr. Valaria GoodPabone next week with instructions for drain removal in the office drain care at home etc.*  Lattie Hawichard E Shela Esses, MD, FACS  07/01/2015

## 2015-07-01 NOTE — Care Management (Signed)
Plan for patient to discharge today on cipro which is on the $4 list. Prilosec coupon provided to patient for $23.00.   Patient is aware that he will have to pay out of pocket for pain medication. RNCM signing off

## 2015-07-01 NOTE — Progress Notes (Signed)
Pt was possible discharge per MD order. However pt states Md did not discharge since he had not had BM in 10 days. Notified Dr Tonita CongWoodham and he is to put orders in.

## 2015-07-01 NOTE — Discharge Instructions (Signed)
Empty and record drainage twice a day Take medications as directed Regular diet May shower Dry dressing as needed to wounds Follow-up with Dr. Valaria GoodPabone in 10 days

## 2015-07-02 MED ORDER — BISACODYL 10 MG RE SUPP
10.0000 mg | Freq: Once | RECTAL | Status: AC
  Administered 2015-07-02: 10 mg via RECTAL
  Filled 2015-07-02: qty 1

## 2015-07-02 NOTE — Progress Notes (Addendum)
Pt to be discharged per MD order. IV removed. Instructions reviewed with pt and family and all questions were answered. Instructed on emptying of JP drain and proper care of incisions. Gave pt scripts. Administered pain meds at 1002 and pt will need to be here one hour to re assess and will then discharge pt in wheelchair.

## 2015-07-03 NOTE — Discharge Summary (Signed)
Patient ID: Eric Dalton MRN: [id number] DOB/AGE: Aug 29, 1985 30 y.o.  Admit date: 06/22/2015 Discharge date: 07/02/2015  Discharge Diagnoses:  Pneumoperitoneum  Procedures Performed: Exploratory Laparotomy  Discharged Condition: good  Hospital Course: Patient admitted on 3/21 with a pneumoperitoneum approximately 2 weeks s/p laparoscopic cholecystectomy. He was observed for 72 hours without improvement and with continued imaging consisted with pneumoperitoneum concerning for perforated viscus. He was taken to the operating room on 3/24 for a diagnostic laparoscopy which was converted to a laparotomy. Duodenitis was identified without an identified perforated viscus. He was widely drained. The remainder of his hospital stay was uneventful. He had gradual return of bowel function and was tolerating a diet and having evidence of bowel function prior to discharge.   Discharge Orders: Discharge Instructions    Nursing communication    Complete by:  As directed   Please instruct patient concerning routine drain care           Disposition: 01-Home or Self Care  Discharge Medications: No current facility-administered medications for this encounter.  Current outpatient prescriptions:  .  ALPRAZolam (XANAX) 1 MG tablet, Take 1 mg by mouth 2 (two) times daily., Disp: , Rfl:  .  sertraline (ZOLOFT) 50 MG tablet, Take 75 mg by mouth daily., Disp: , Rfl:  .  bisacodyl (DULCOLAX) 10 MG suppository, Place 1 suppository (10 mg total) rectally as needed for moderate constipation., Disp: 12 suppository, Rfl: 0 .  ciprofloxacin (CIPRO) 500 MG tablet, Take 1 tablet (500 mg total) by mouth 2 (two) times daily., Disp: 20 tablet, Rfl: 1 .  omeprazole (PRILOSEC) 40 MG capsule, Take 1 capsule (40 mg total) by mouth daily., Disp: 30 capsule, Rfl: 1 .  oxyCODONE-acetaminophen (ROXICET) 5-325 MG tablet, Take 1 tablet by mouth every 4 (four) hours as needed for moderate pain., Disp: 30 tablet, Rfl:  0  Follwup: Follow-up Information    Follow up with Eric Roiego F Pabon, MD. Go on 07/07/2015.   Specialty:  General Surgery   Why:  Wednesday at 10:45am for hospital follow-up.   Contact information:   [address] [place] [id number] [phone]       Signed: [name] 07/03/2015, 9:58 AM

## 2015-07-07 ENCOUNTER — Other Ambulatory Visit
Admission: RE | Admit: 2015-07-07 | Discharge: 2015-07-07 | Disposition: A | Discharge status: Home / Self Care | Payer: MEDICAID | Source: Ambulatory Visit | Attending: Surgery | Admitting: Surgery

## 2015-07-07 ENCOUNTER — Encounter: Payer: Self-pay | Admitting: Surgery

## 2015-07-07 ENCOUNTER — Ambulatory Visit (INDEPENDENT_AMBULATORY_CARE_PROVIDER_SITE_OTHER): Payer: Self-pay | Admitting: Surgery

## 2015-07-07 VITALS — Ht 68.0 in

## 2015-07-07 DIAGNOSIS — R300 Dysuria: Secondary | ICD-10-CM

## 2015-07-07 DIAGNOSIS — K2980 Duodenitis without bleeding: Secondary | ICD-10-CM

## 2015-07-07 DIAGNOSIS — K8010 Calculus of gallbladder with chronic cholecystitis without obstruction: Secondary | ICD-10-CM

## 2015-07-07 DIAGNOSIS — R109 Unspecified abdominal pain: Secondary | ICD-10-CM | POA: Insufficient documentation

## 2015-07-07 LAB — URINALYSIS COMPLETE WITH MICROSCOPIC (ARMC ONLY)
Bilirubin Urine: NEGATIVE
Glucose, UA: NEGATIVE mg/dL
HGB URINE DIPSTICK: NEGATIVE
KETONES UR: NEGATIVE mg/dL
LEUKOCYTES UA: NEGATIVE
NITRITE: NEGATIVE
PROTEIN: NEGATIVE mg/dL
SPECIFIC GRAVITY, URINE: 1.012 (ref 1.005–1.030)
pH: 6 (ref 5.0–8.0)

## 2015-07-07 NOTE — Patient Instructions (Addendum)
You should increase water intake and activity level as much as possible to help with bowel movement. Also you may use Miralax over the counter x 2 doses, 6 hours apart, followed by Dulcolax 10mg  tablets x 2 doses, 6 hours apart until you have a successful bowel movement. If you still do not have a bowel movement after either of these medications, you should do 2 fleets enemas back to back. Asked patient to call back for further instructions if after taking both doses of these medications, they are still unsuccessful with a bowel movement. You may use Miralax 17 grams daily if you continue to have constipation after bowels have began moving.   We will have you follow-up in office in 2 weeks with Dr. Everlene FarrierPabon.  [facility] and have your Urine Test done. We will call you as soon as we have the results.  Please call with any questions or concerns.

## 2015-07-07 NOTE — Progress Notes (Signed)
30 year old male who had laparoscopic cholecystectomy on March 9 and then had free air and duodenitis seen on exploratory laparotomy on March 24. Patient has been out of the hospital with minimal drains coming from both JP drains. Patient states his only been eating once a day but that this was his normal prior to. Patient states that the pain is better although he is having some pain around the drain sites. Patient also is having some constipation and he has not been increasing his water intake or using anything to try and help in this area.  Pe:  Gen: NAD Abd: soft, appropriately tender, JP drains removed, minimal serous drainage, incision site clean and dry, staples removed   A/P:  I discussed with the patient and his mother, was called the sister Dena who is a Publishing rights managernurse practitioner, that he needs to increase his water intake and use MiraLAX and Dulcolax for constipation. Also discussed with him that he needs to be eating multiple small meals a day and not just eating once a day that this will help them heal in the long run. Also discussed with him that he needs to get up and take a walk for at least 30 minutes a day. We'll have him follow-up in 2 weeks of my partner Dr. Everlene FarrierPabon to recheck wounds and ensure he is improving.

## 2015-07-08 LAB — URINE CULTURE: CULTURE: NO GROWTH

## 2015-07-26 ENCOUNTER — Ambulatory Visit (INDEPENDENT_AMBULATORY_CARE_PROVIDER_SITE_OTHER): Payer: Self-pay | Admitting: Surgery

## 2015-07-26 ENCOUNTER — Encounter: Payer: Self-pay | Admitting: Surgery

## 2015-07-26 VITALS — BP 144/79 | HR 55 | Temp 97.8°F | Ht 68.0 in | Wt 172.0 lb

## 2015-07-26 DIAGNOSIS — Z09 Encounter for follow-up examination after completed treatment for conditions other than malignant neoplasm: Secondary | ICD-10-CM

## 2015-07-26 NOTE — Patient Instructions (Signed)
Please call our office with any questions or concerns.  Please use over the counter fiber to help bulk up stools. Please use Ensure  Three times daily.  Follow up with your primary care doctor for the incontinence.

## 2015-07-26 NOTE — Progress Notes (Signed)
*  Eric Dalton's following up after a recent exploratory laparotomy performed by me on 06/25/2015 for free air after a laparoscopic cholecystectomy by Dr. Orvis BrillLoflin. Intraoperatively only findings where duodenitis. Patient has done well he is taking by mouth but has lost some weight. He is having daily bowel movements. Some watery once. He also reports having some "" accidents". Both urinary and fecal incontinence. Please note that this is not all the time  PE NAD Abd: soft, NT incision c/d/i, no infection   A/P doing well from surgical perspective Advice to take boost or ensure to supplement PO intake F/U w pcp regarding incontinence issue ( not related to surgery) Might be related to neurological condition that may require further w/u by pcp. D/W pt and family about this in detail RTC PRN

## 2018-01-29 IMAGING — CT CT ABD-PELV W/ CM
1 of 3 series · 14 of 32 positions shown, 19 images · IV contrast (omnipaque)
Comparison: 06/08/2015

CLINICAL DATA: Severe abdominal pain for the last 2 days, patient
is 12 days status post cholecystectomy

EXAM:
CT ABDOMEN AND PELVIS WITH CONTRAST
TECHNIQUE: Multidetector CT imaging of the abdomen and pelvis was performed
using the standard protocol following bolus administration of
intravenous contrast.
CONTRAST:  100mL OMNIPAQUE IOHEXOL 300 MG/ML  SOLN

[Series 2: routine abd pel with · axial · 0.75mm/px · z∈[-509,-104]mm · 14 of 91 slices shown, 19 images]
[im 5/91  soft-tissue]
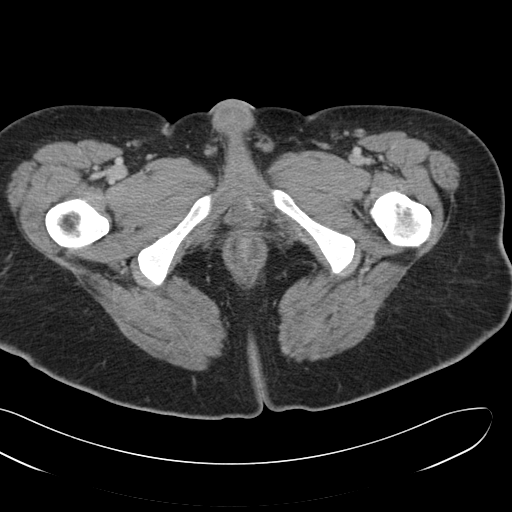
[im 5/91  bone]
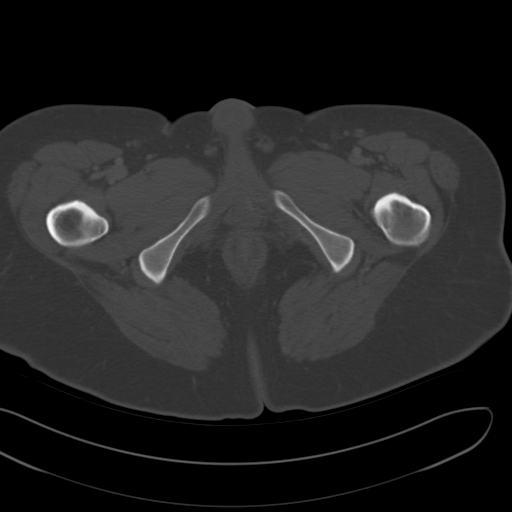
[im 15/91  soft-tissue]
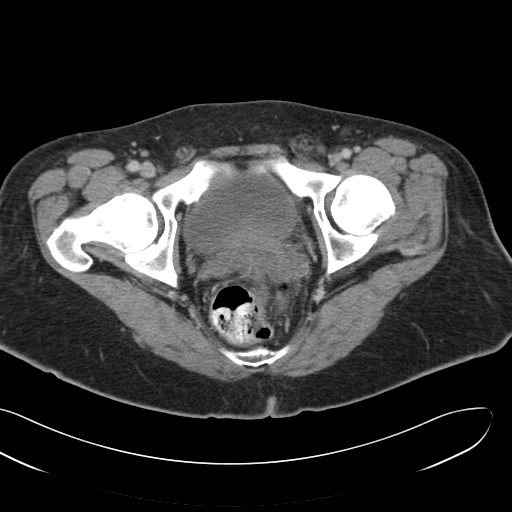
[im 19/91  soft-tissue]
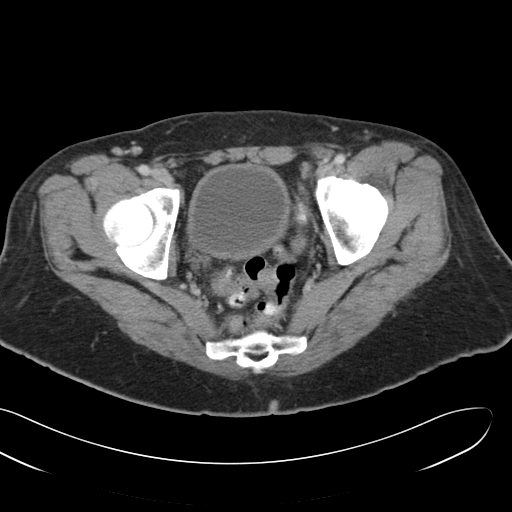
[im 24/91  soft-tissue]
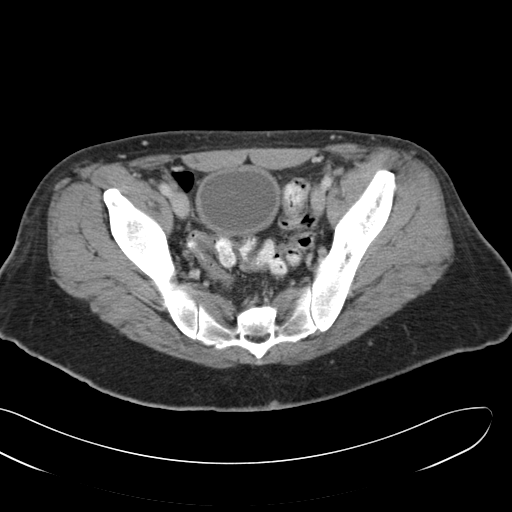
[im 34/91  soft-tissue]
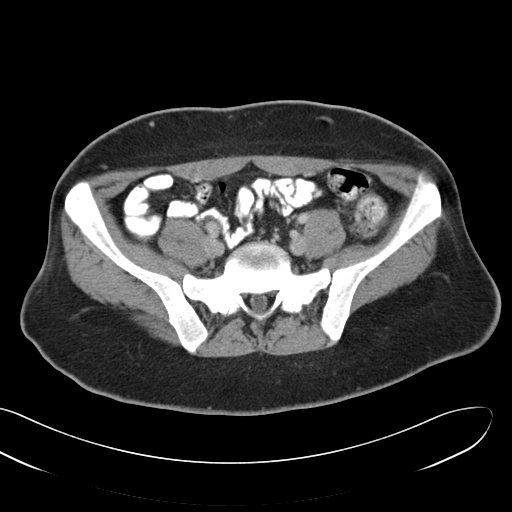
[im 38/91  soft-tissue]
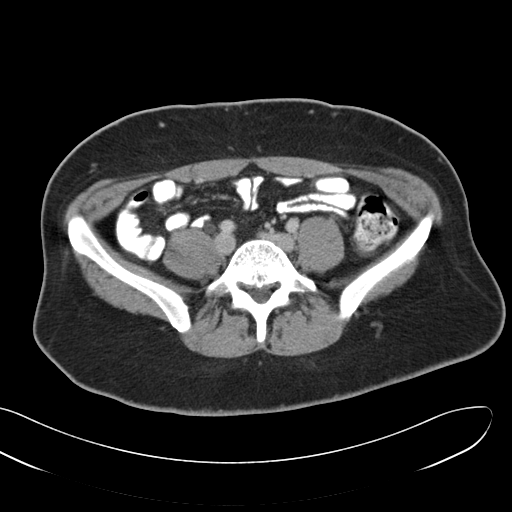
[im 48/91  soft-tissue]
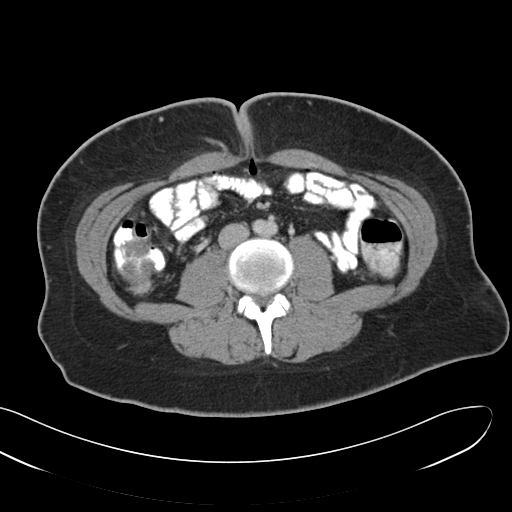
[im 53/91  soft-tissue]
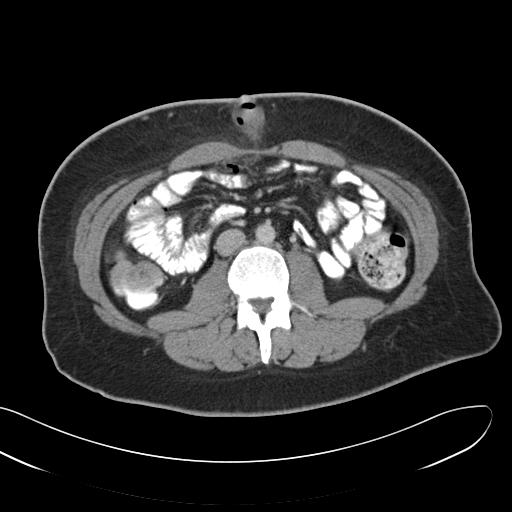
[im 57/91  soft-tissue]
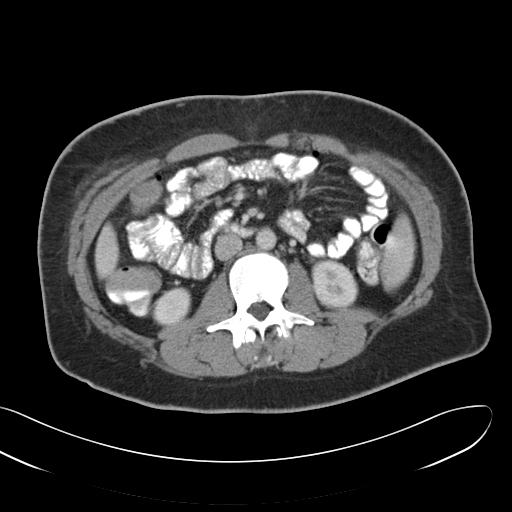
[im 57/91  bone]
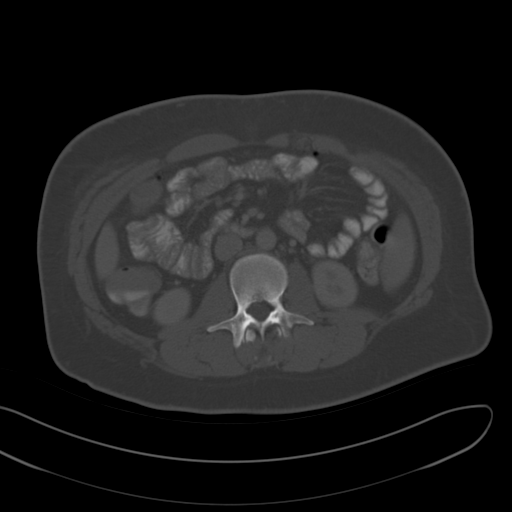
[im 67/91  soft-tissue]
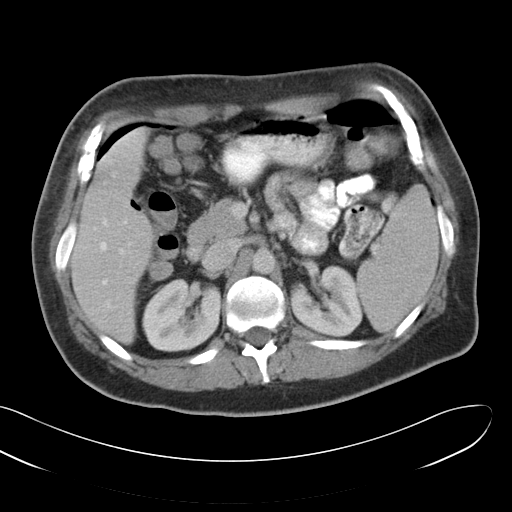
[im 72/91  soft-tissue]
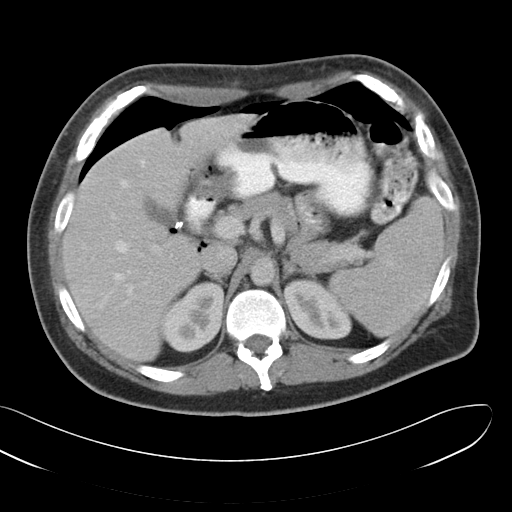
[im 72/91  lung]
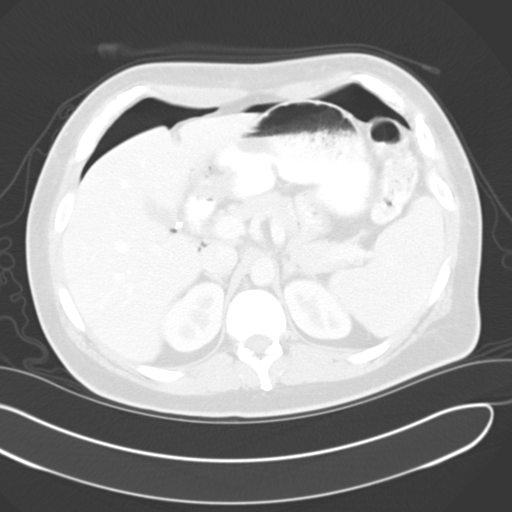
[im 76/91  soft-tissue]
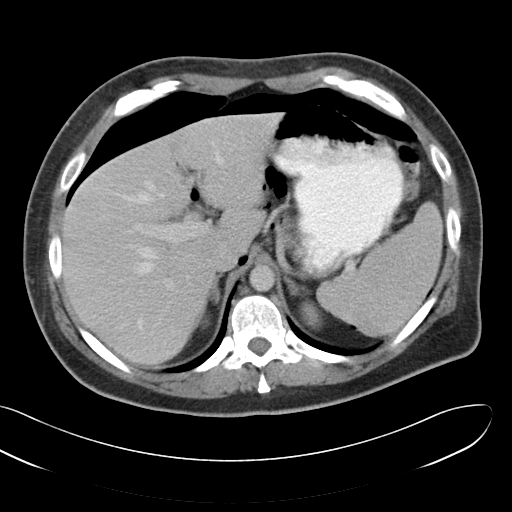
[im 76/91  lung]
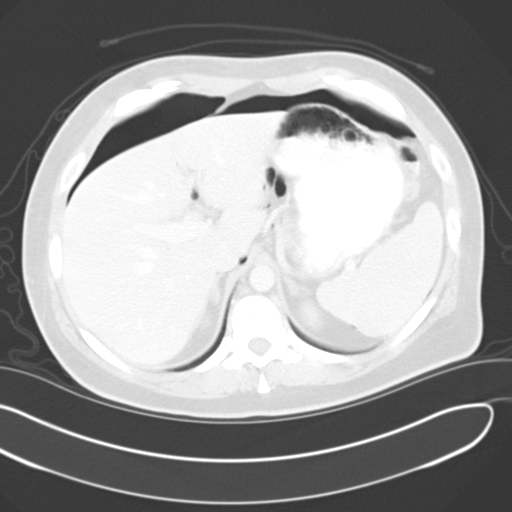
[im 81/91  lung]
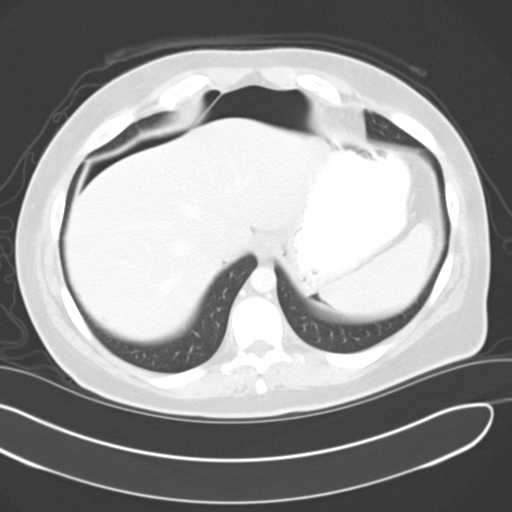
[im 86/91  soft-tissue]
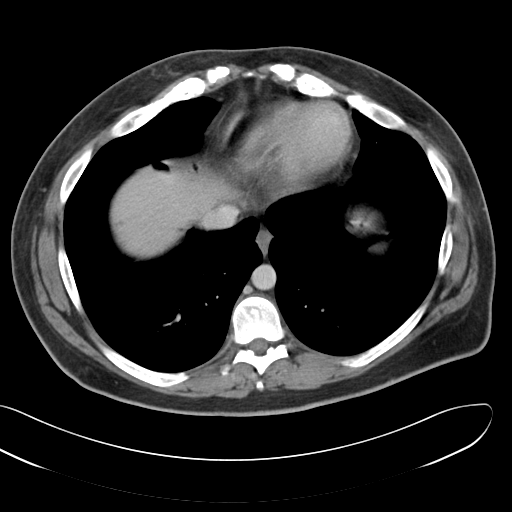
[im 86/91  lung]
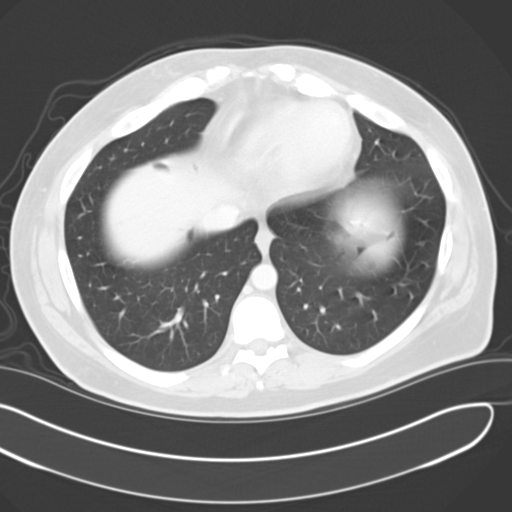

[14 of 32 positions shown; findings below may reference images not displayed]

FINDINGS: Lower chest:  Negative

Hepatobiliary: Liver is normal. There are clips in the gallbladder
fossa with trace residual postoperative fluid in the gallbladder
fossa.

Pancreas: Negative

Spleen: Spleen is normal

Adrenals/Urinary Tract: Kidneys and adrenal glands are normal. Mild
diffuse bladder wall thickening stable.

Stomach/Bowel: Diverticulosis sigmoid colon. No evidence of
diverticulitis. Nonobstructive gas pattern. Stomach and small bowel
appear normal.

Vascular/Lymphatic: No vascular abnormalities. No significant
adenopathy.

Reproductive: Negative

Other: Moderate volume of pneumoperitoneum. This is seen primarily
anteriorly over the upper abdomen, but foci of pneumoperitoneum are
seen in the gallbladder fossa, porta hepatis, gastrohepatic
ligament, as well as anteriorly in the inferior abdomen and pelvis.
There is subcutaneous collection of air in fluid measuring 23 x 36
mm in the retro umbilical region.

Musculoskeletal: No acute musculoskeletal findings
IMPRESSION: Excessive pneumoperitoneum for a patient that is 12 days postop,
concerning for bowel perforation, but without any further evidence
to support this.

Additionally, anticipated tiny postoperative fluid collection in the
gallbladder fossa and small subcutaneous periumbilical postoperative
focus of air and fluid.

Critical Value/emergent results were called by telephone at the time
of interpretation on 06/22/2015 at [DATE] to Dr. SONIA YOLANDA SILVIA , who
verbally acknowledged these results.
